# Patient Record
Sex: Male | Born: 1945 | Race: White | Hispanic: No | Marital: Married | Smoking: Never smoker
Health system: Southern US, Community
[De-identification: ages and names within clinical notes are randomized; demographics above are authoritative.]

## PROBLEM LIST (undated history)

## (undated) DIAGNOSIS — E119 Type 2 diabetes mellitus without complications: Secondary | ICD-10-CM

## (undated) DIAGNOSIS — M353 Polymyalgia rheumatica: Secondary | ICD-10-CM

## (undated) HISTORY — PX: NO PAST SURGERIES: SHX2092

## (undated) HISTORY — DX: Type 2 diabetes mellitus without complications: E11.9

## (undated) HISTORY — DX: Polymyalgia rheumatica: M35.3

---

## 1999-07-11 ENCOUNTER — Emergency Department (HOSPITAL_COMMUNITY): Admission: EM | Admit: 1999-07-11 | Discharge: 1999-07-12 | Payer: Self-pay | Admitting: Emergency Medicine

## 1999-07-12 ENCOUNTER — Encounter: Payer: Self-pay | Admitting: Emergency Medicine

## 2000-02-19 ENCOUNTER — Encounter: Payer: Self-pay | Admitting: *Deleted

## 2000-02-19 ENCOUNTER — Encounter: Payer: Self-pay | Admitting: Family Medicine

## 2000-02-19 ENCOUNTER — Inpatient Hospital Stay (HOSPITAL_COMMUNITY): Admission: EM | Admit: 2000-02-19 | Discharge: 2000-02-22 | Payer: Self-pay | Admitting: Emergency Medicine

## 2000-02-20 ENCOUNTER — Encounter: Payer: Self-pay | Admitting: Emergency Medicine

## 2004-03-10 ENCOUNTER — Encounter: Admission: RE | Admit: 2004-03-10 | Discharge: 2004-03-10 | Payer: Self-pay | Admitting: Gastroenterology

## 2004-03-23 ENCOUNTER — Ambulatory Visit (HOSPITAL_COMMUNITY): Admission: RE | Admit: 2004-03-23 | Discharge: 2004-03-23 | Payer: Self-pay | Admitting: Gastroenterology

## 2004-03-23 ENCOUNTER — Encounter (INDEPENDENT_AMBULATORY_CARE_PROVIDER_SITE_OTHER): Payer: Self-pay | Admitting: *Deleted

## 2006-04-10 IMAGING — RF DG ESOPHAGUS
7 series · 20 of 24 positions shown · non-contrast
Comparison: none

CLINICAL DATA: Dysphagia.
 BARIUM ESOPHAGRAM:
 Primary peristaltic wave in the esophagus is normal.  The cervical esophagus had a normal appearance.  There was a small sliding hiatal hernia with a distal esophageal mucosal ring (Schatzki ring) above the hiatal hernia.  This was not obstructive to a 13 mm in diameter barium tablet.  The remainder of the esophagus had a normal appearance.  There were no episodes of spontaneous gastroesophageal reflux.

[Series 1: run · 4 of 7 slices shown (1 of 7)]
[im 1/7]
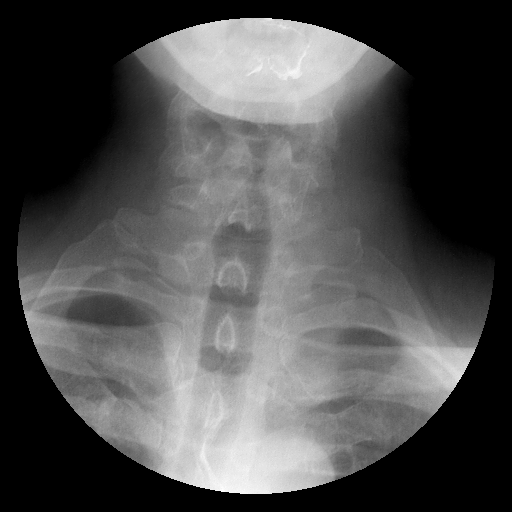
[im 2/7]
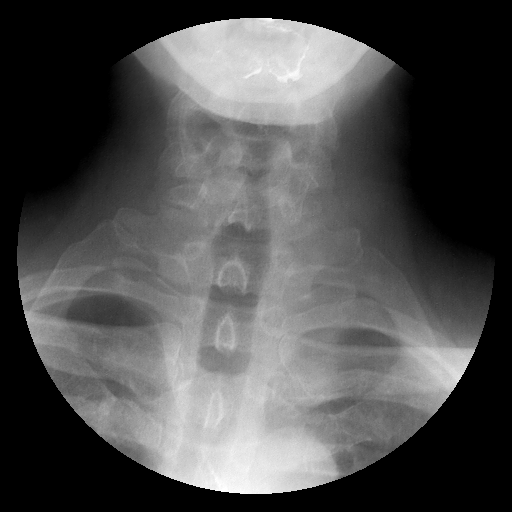
[im 5/7]
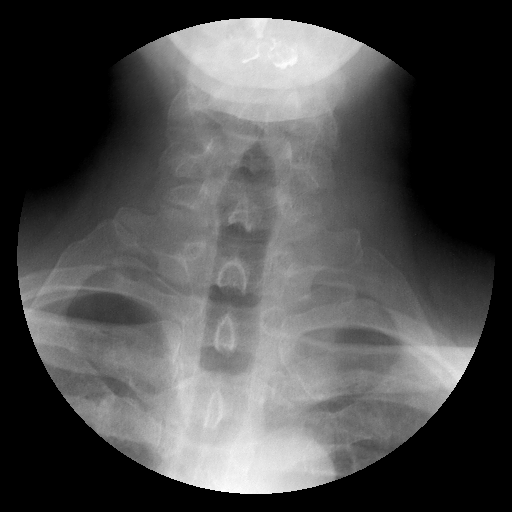
[im 7/7]
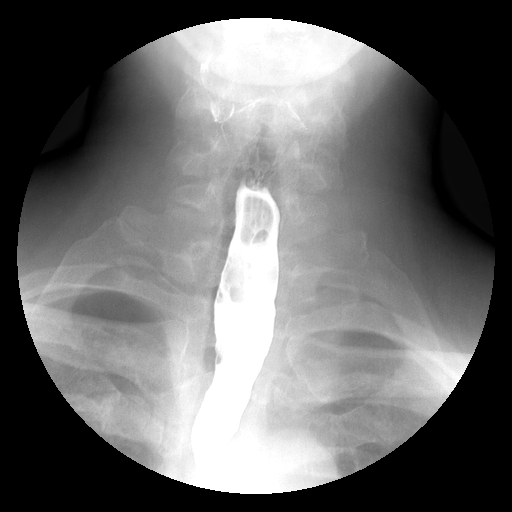

[Series 2: run · 4 of 5 slices shown (2 of 7)]
[im 1/5]
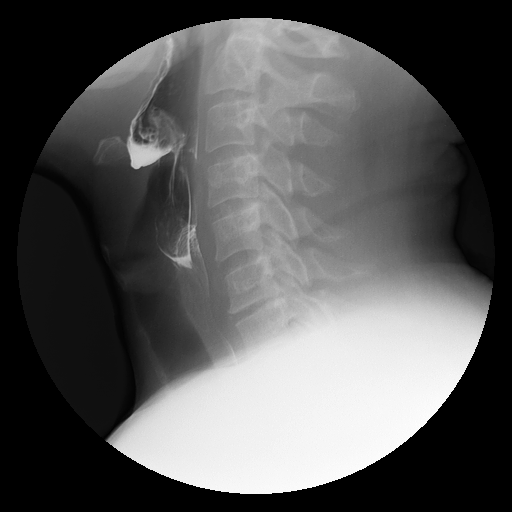
[im 2/5]
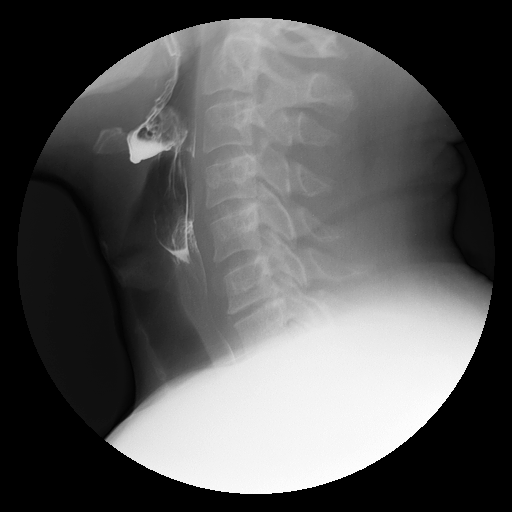
[im 3/5]
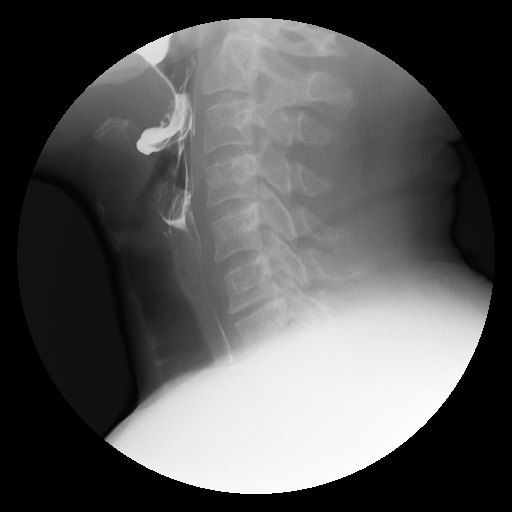
[im 5/5]
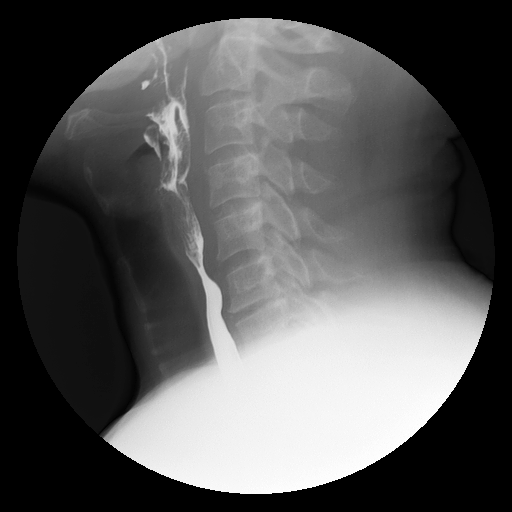

[Series 3: run · 4 of 5 slices shown (3 of 7)]
[im 1/5]
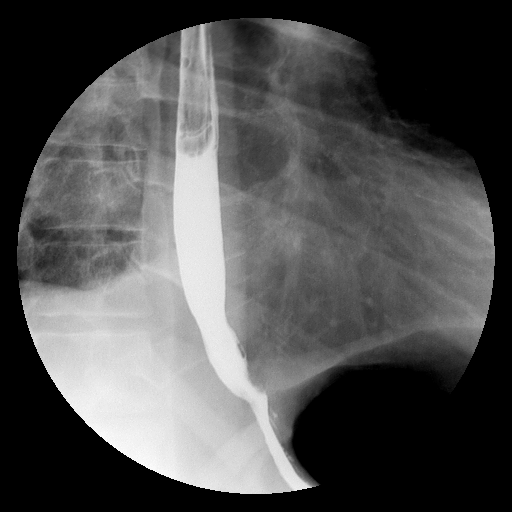
[im 2/5]
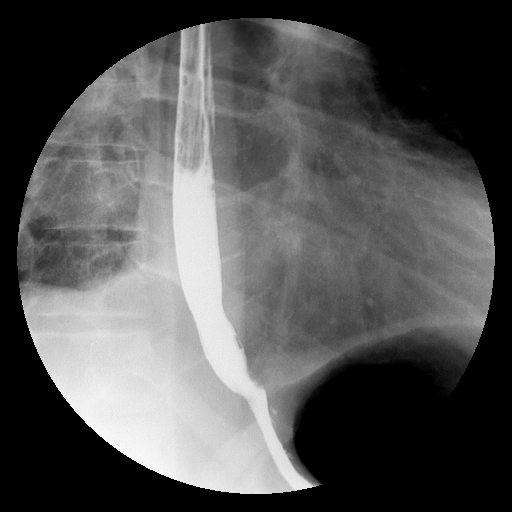
[im 3/5]
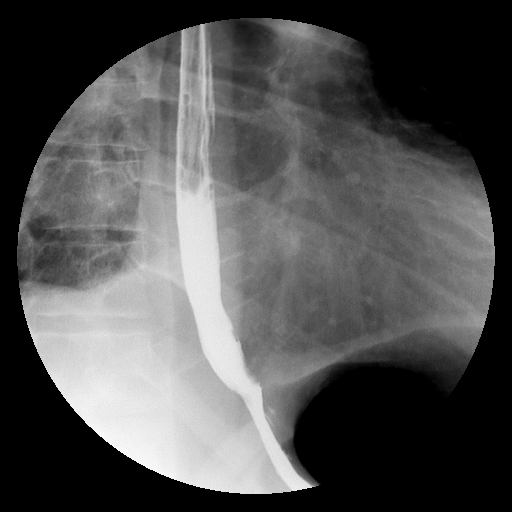
[im 4/5]
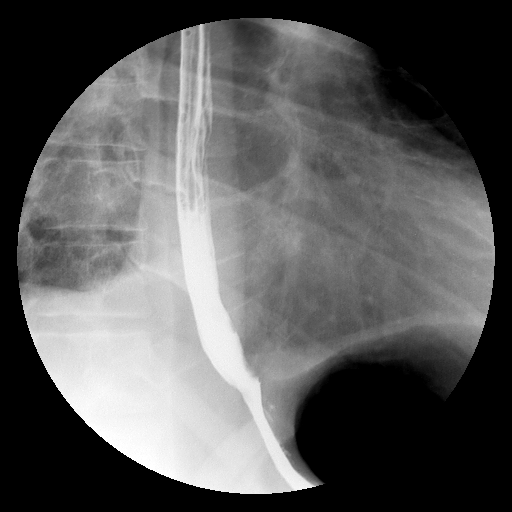

[Series 4: run · 3 of 3 slices shown (4 of 7)]
[im 1/3]
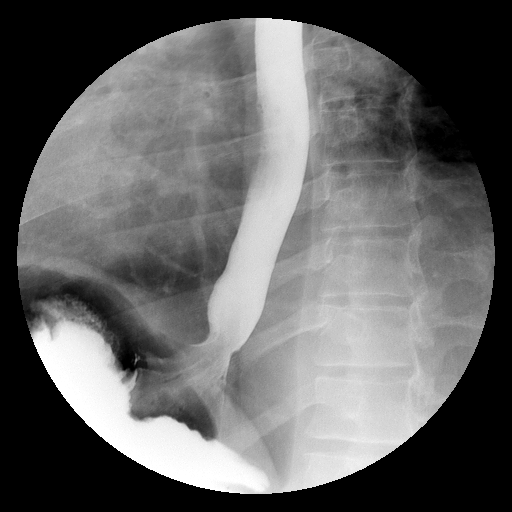
[im 2/3]
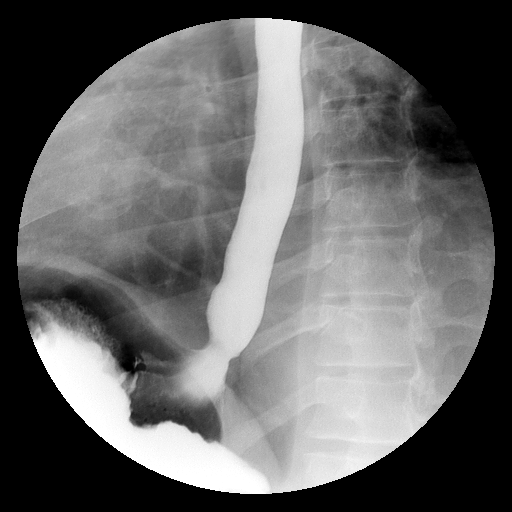
[im 3/3]
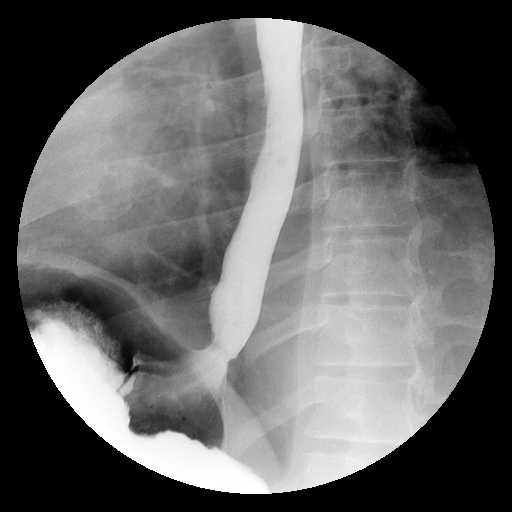

[Series 5: run · 2 of 3 slices shown (5 of 7)]
[im 1/3]
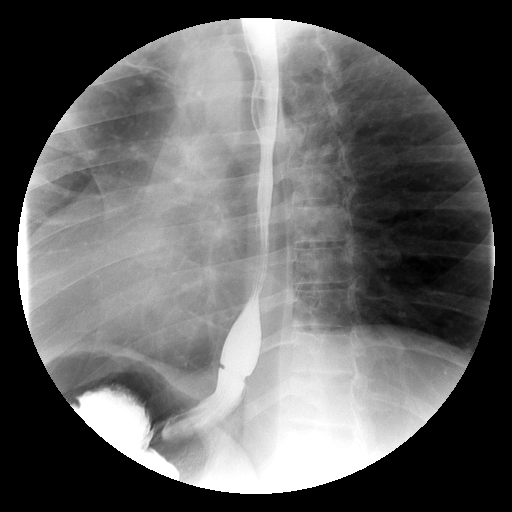
[im 2/3]
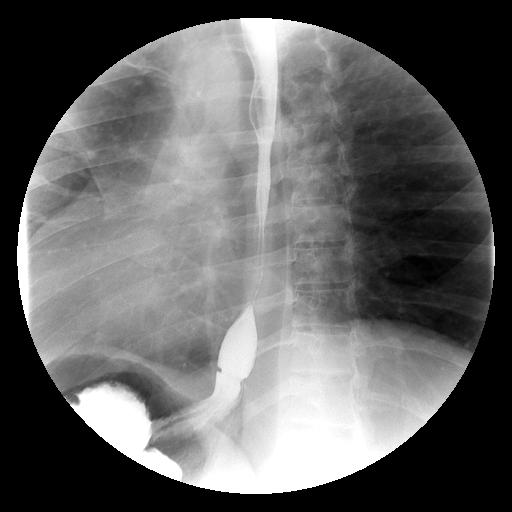

[Series 6: run · 2 of 2 slices shown (6 of 7)]
[im 1/2]
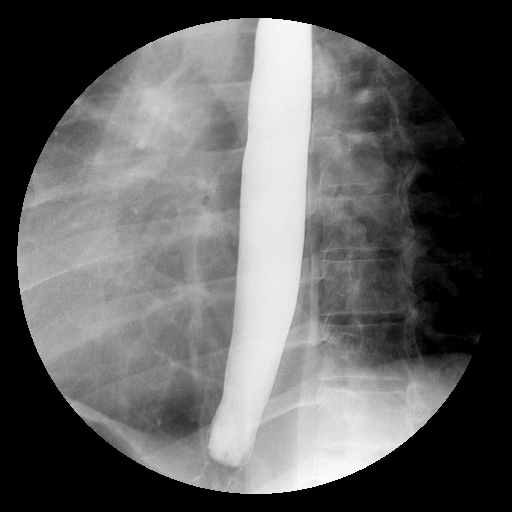
[im 2/2]
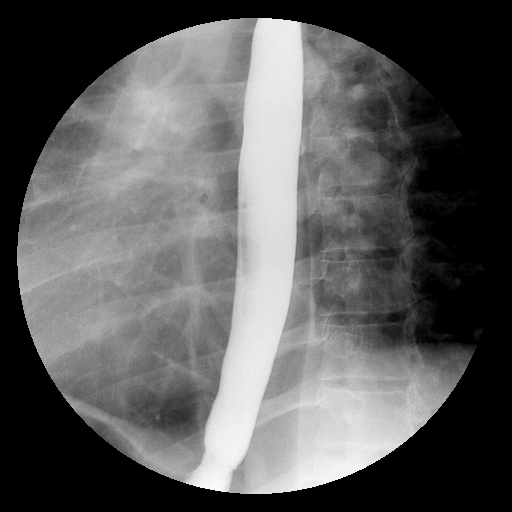

[Series 7: run · 1 of 1 slices shown (7 of 7)]
[im 1/1]
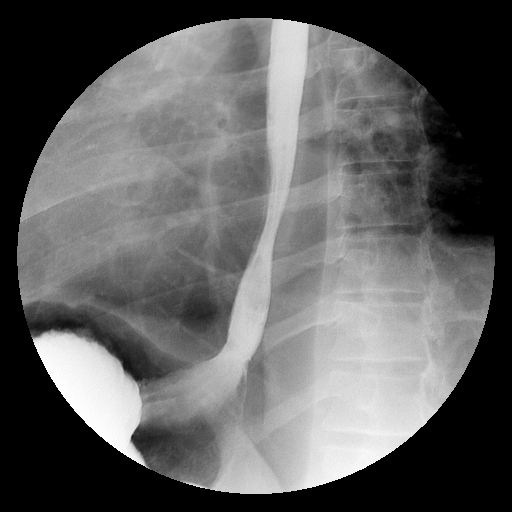

[20 of 24 positions shown; findings below may reference images not displayed]

IMPRESSION: Small sliding hiatal hernia with distal esophageal mucosal ring/stricture seen above the hiatal hernia (this is not obstructive to a 13 mm in diameter barium tablet).

## 2006-12-06 ENCOUNTER — Encounter: Admission: RE | Admit: 2006-12-06 | Discharge: 2006-12-06 | Payer: Self-pay | Admitting: General Surgery

## 2006-12-07 ENCOUNTER — Encounter (INDEPENDENT_AMBULATORY_CARE_PROVIDER_SITE_OTHER): Payer: Self-pay | Admitting: General Surgery

## 2006-12-07 ENCOUNTER — Ambulatory Visit (HOSPITAL_BASED_OUTPATIENT_CLINIC_OR_DEPARTMENT_OTHER): Admission: RE | Admit: 2006-12-07 | Discharge: 2006-12-07 | Payer: Self-pay | Admitting: General Surgery

## 2008-09-15 ENCOUNTER — Emergency Department (HOSPITAL_COMMUNITY): Admission: EM | Admit: 2008-09-15 | Discharge: 2008-09-16 | Payer: Self-pay | Admitting: Emergency Medicine

## 2010-06-15 NOTE — Op Note (Signed)
NAME:  GLOVER, CAPANO NO.:  [id number]   MEDICAL RECORD NO.:  [id number]          PATIENT TYPE:  AMB   LOCATION:  DSC                          FACILITY:  [facility]   PHYSICIAN:  Cherylynn Ridges, M.D.    DATE OF BIRTH:  Oct 12, 1945   DATE OF PROCEDURE:  12/07/2006  DATE OF DISCHARGE:                               OPERATIVE REPORT   PREOPERATIVE DIAGNOSIS:  Right chest wall lipoma.   POSTOPERATIVE DIAGNOSIS:  A 7 x 6 cm right chest wall lipoma.   PROCEDURE:  Excision of right chest wall lipoma.   SURGEON:  Jimmye Norman, M.D.   ANESTHESIA:  Monitored anesthesia care with 1% Xylocaine and 0.25%  Marcaine local with epinephrine.   ESTIMATED BLOOD LOSS:  Less than 10 mL.   COMPLICATIONS:  None.   CONDITION:  Stable.   FINDINGS:  Normal detachable 7 x 6 cm lipoma of the lateral right chest  wall.   DESCRIPTION OF OPERATION:  The patient was taken to the operating room  and placed on the table in the supine position.  After an adequate  amount of IV sedation was given, he was tilted up to the left somewhat  and then his right chest wall prepped and draped in the usual sterile  manner.   We anesthetized directly on top of the lipoma which was marked with a  marking pen.  We then made an incision using a 15 blade down into the  subcutaneous tissue.  It was below the level of Scarpa fascia that we  were able to excise this large lipoma 7 x 6 cm in size using  electrocautery.  We then closed using 3-0 Vicryl deep layer and then a  subcuticular stitch of 4-0 Monocryl on the skin.  Prior to closure,  0.25% Marcaine with epi was injected into the wound.  Sterile dressing  was applied including Dermabond, Steri-Strips and Tegaderm.  All counts  were correct.      Cherylynn Ridges, M.D.  Electronically Signed     JOW/MEDQ  D:  12/07/2006  T:  12/07/2006  Job:  [id number]

## 2010-06-18 NOTE — Consult Note (Signed)
[place]  Patient:    Derek Atkins, Derek Atkins                       MRN: [id number] Proc. Date: 02/19/00 Adm. Date:  [id number] Attending:  Irving Copas CC:         Illa Level, M.D.   Consultation Report  DATE OF BIRTH:  26-Feb-1945  CHIEF COMPLAINT:  Unsteadiness and slurred speech.  HISTORY OF THE PRESENT CONDITION:  The patient is a 65 year old right-handed married Caucasian gentleman who had the sudden onset of swimmy headedness, dysarthria, incoordination and a mild left occipital headache.  The patient had to hold on to keep from falling on over.  He thought that he was just hungry.  He ate lunch and rested, but continued to have symptoms. The men he was with insisted on him coming to the hospital.  The episode happened around noon, and he presented for evaluation around 3:00 p.m.  In the emergency room, the patients symptoms have largely subside other than mild unsteadiness and a feeling of weakness in the fifth finger on the right hand.  The patient also feels that he may be speaking somewhat indistinctly.  REVIEW OF SYSTEMS:  The patient has been a healthy person without change in appetite or sleep patterns, night sweats or fevers.  HEAD AND NECK: No otitis media pharyngitis or sinusitis.  CARDIOVASCULAR: No palpitations, chest pain, shortness of breath, or dyspnea on exertion.  RESPIRATORY: No asthma, bronchitis, pneumonia, hemoptysis or productive cough.  GASTROINTESTINAL: No nausea, vomiting, diarrhea or constipation.  GENITOURINARY: No hematuria or dysuria.  No urgency or frequency.  MUSCULOSKELETAL: No fractures, sprains or deformities.  SKIN: No rash.  HEMATOLOGIC: No anemia, bruisability or lymphadenopathy.  ENDOCRINE: No diabetes or thyroid disease.  IMMUNOLOGY: No allergies or immune disorders.  REPRODUCTIVE: No erectile dysfunction. NEUROLOGIC: See above.  No dysphasia, tinnitus, syncope, vertigo,  weakness, numbness, tingling, or loss of bowel or bladder control.  No seizures. PSYCHIATRIC: No anxiety, depression, mood disorder or psychosis.  Review of systems is otherwise negative.  CURRENT MEDICATIONS:  None.  ALLERGIES:  None.  PAST MEDICAL HISTORY:  The patient has been hospitalized once for otitis media with complications when he was in junior high school.  PAST SURGICAL HISTORY:  None.  FAMILY HISTORY:  The patient was adopted and has no knowledge of his biologic parents.  SOCIAL HISTORY:  The patient is married.  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  He drinks one beer per day.  He does not use tobacco or drugs.  PHYSICAL EXAMINATION:  GENERAL:  Well-developed, well-nourished, pleasant, right-handed gentleman in no distress.  VITAL SIGNS:  Blood pressure 119/75, resting pulse 70, respirations 20 pulse oximetry 98%, temperature 98.4.  HEENT:  No signs of infection.  No cranial bruits.  NECK:  Supple.  Full range of motion.  No cervical bruits.  LUNGS:  Clear to auscultation.  HEART:  No murmurs.  Pulses normal.  ABDOMEN:  Soft and nontender.  Bowel sounds normal .  No hepatosplenomegaly.  EXTREMITIES:  Normal.  No edema, cyanosis or altered tone.  NEUROLOGIC:  Mental status - Awake, alert, attentive, appropriate, no dysphasia or dyspraxia.  Cranial nerves - Round reactive pupils.  Normal fundi.  Full visual fields to double simultaneous stimuli.  Extraocular movements full and conjugate.  OKN responses equal bilaterally.  Symmetric fascial strength and sensation.  Air conduction greater than bone  conduction bilaterally.  Midline tongue and uvula.  Motor - Normal strength, tone and mass.  Good fine motor movements.  No pronator drift.  Sensation intact to cold, vibration, stereoagnosis.  Two-point discrimination was 3 mm bilaterally. Cerebellar - Good finger-to-nose and rapid repetitive movements. Gait was normal except  for tandem.  He was a little clumsy.  He was able to get up on his heels and toes well.  Deep tendon reflexes were normal except at the ankles, which were diminished.  The patient had bilateral flexor plantar responses.  IMPRESSION:  Transient ischemic attack - I suspect brainstem or cerebellar location, 435.8.  No known risk factors.  I cannot rule out a migraine variant, but I think it is unlikely.  PLAN: 1. IV heparin tonight and throughout the hospitalization until workup is    complete. 2, Workup for treatable causes of stroke in the young including procoagulants,    protein S, protein C, antithrombin 3, factor V Leiden mutation, vasculitis    (anticardiolipin antibody panel, sedimentation rate and ANA), amino    acidopathy, serum homocystine toxins (cocaine), dyslipidemia lipid panel.    All of the above except for lipid panel on January 19.  The lipid panel    will be fasting on January 20.  MRI/MRA intracranial on January 20.    Two-dimensional echocardiogram and carotid Doppler on January 21. Secondary    stroke prevention will be determined based on the etiology and any risk    factors that have been identified.  SUMMARY:  The patients examination is basically normal at this time, and so rehabilitation will not be necessary.  I appreciate the opportunity to see the patient.  This has been discussed at length with Dr. Abigail Miyamoto and I have personally reviewed the CT scan of the brain tonight, which is normal.  OTHER LABORATORY DATA:  Sodium 138, potassium 4.4, chloride 104, CO2 32, BUN 14, creatinine 1.4, glucose 115.  Blood gas shows pH 7.37, PCO2 (venous) 52.1, bicarbonate 30.  EKG shows normal sinus rhythm without evidence of prolonged Q-T interval. DD:  02/19/00 TD:  02/20/00 Job: [id number]

## 2010-06-18 NOTE — Op Note (Signed)
NAME:  Derek Atkins, Derek Atkins NO.:  [id number]   MEDICAL RECORD NO.:  [id number]          PATIENT TYPE:  AMB   LOCATION:  ENDO                         FACILITY:  [facility]   PHYSICIAN:  Graylin Shiver, M.D.   DATE OF BIRTH:  1946/01/11   DATE OF PROCEDURE:  03/23/2004  DATE OF DISCHARGE:                                 OPERATIVE REPORT   PROCEDURE:  Esophagogastroduodenoscopy with endoscopic balloon dilatation of  a Schatzki's ring.   ENDOSCOPIST:  Graylin Shiver, M.D.   INDICATIONS FOR PROCEDURE:  Dysphagia, abnormal barium swallow showing a  hiatal hernia with a Schatzki's ring.   INFORMED CONSENT:  Informed consent was obtained after explanation of the  risks of bleeding, infection and perforation.   PREMEDICATIONS:  Fentanyl 40 mcg IV, Versed 4 mg IV.   PROCEDURE:  With the patient in the left lateral decubitus position, the  Olympus gastroscope was inserted into the oropharynx and passed into the  esophagus.  It was advanced down the esophagus, then into the stomach and  into the duodenum.  The second portion and bulb of the duodenum were normal.  The stomach had normal-appearing mucosa in the antrum and body as well as  the upper fundus and cardia seen on retroflexion.  The scope was  straightened and brought back.  There was a small hiatal hernia.  At 38 cm,  there was a distal esophageal Schatzki's ring.  An endoscopic balloon  dilator was advanced down the scope and appropriately placed at the level of  the Schatzki's ring.  It was inflated to 15, then 16.5 mm, held in place at  each level for 30 seconds.  It was then inflated to 18 mm and held in place  for 1 minute.  The balloon was then deflated and removed.  There was some  heme at the site of the dilatation, but not much.  The rest of the esophagus  looked normal.  He tolerated the procedure well without complications.   IMPRESSION:  1.  Schatzki's ring, dilated to 18 mm.  2.  Hiatal hernia.   PLAN:   We will observe the response to the dilatation; hopefully, this will  improve his symptoms of dysphagia.      SFG/MEDQ  D:  03/23/2004  T:  03/23/2004  Job:  [id number]   cc:   Dellis Anes. Idell Pickles, M.D.  [address]  [place]  [place] [address]  Fax: [phone]

## 2010-06-18 NOTE — Op Note (Signed)
NAME:  Derek Atkins, Derek Atkins NO.:  [id number]   MEDICAL RECORD NO.:  [id number]          PATIENT TYPE:  AMB   LOCATION:  ENDO                         FACILITY:  [facility]   PHYSICIAN:  Graylin Shiver, M.D.   DATE OF BIRTH:  1945-11-09   DATE OF PROCEDURE:  03/23/2004  DATE OF DISCHARGE:                                 OPERATIVE REPORT   PROCEDURE:  Colonoscopy with biopsy.   ENDOSCOPIST:  Graylin Shiver, M.D.   INDICATIONS FOR PROCEDURE:  Rectal bleeding.  Rule out colon lesion.   INFORMED CONSENT:  An informed consent was explained after an explanation of  the risks of bleeding, infection and perforation.   PREMEDICATION:  The procedure was done after an EGD with dilatation, with an  additional 20 mcg of fentanyl and 2 mg of Versed given IV.   DESCRIPTION OF PROCEDURE:  With the patient in the left lateral decubitus  position, a rectal exam was performed.  No masses were felt.  The Olympus  colonoscope was inserted into the rectum and advanced around the colon to  the cecum.  The cecal landmarks were identified.  The cecum and ascending  colon looked normal.  The transverse colon looked normal.  The descending  colon and sigmoid revealed diverticulosis.  In the rectum there was some  patchy erythema.  This was biopsied to see if this is consistent with  proctitis.  The scope was retroflexed in the rectum.  There were some small  internal hemorrhoids.  The scope was drained and brought out.   He tolerated the procedure well without complications.   IMPRESSION:  1.  Diverticulosis of the left colon.  2.  Erythema in the rectum, which was biopsied to look for any evidence of      proctitis.  3.  Internal hemorrhoids.      SFG/MEDQ  D:  03/23/2004  T:  03/23/2004  Job:  [id number]   cc:   Dellis Anes. Idell Pickles, M.D.  [address]  [place]  [place] [address]  Fax: [phone]

## 2010-06-18 NOTE — H&P (Signed)
[place]. [facility]  Patient:    Derek Atkins, Derek Atkins                       MRN: [id number] Adm. Date:  02/19/00 Attending:  Chales Salmon. Abigail Miyamoto, M.D.                         History and Physical  HISTORY OF PRESENT ILLNESS:  This 65 year old white male had the sudden onset at about noon today of dizziness which he says was not vertigo.  Slurred speech, lack of coordination, and mild headache.  He had no loss of consciousness.  He denied any chest pain or palpitations.  He ate lunch and rested briefly, but his symptoms persisted, so he presented to the emergency room.  In the emergency room his symptoms are improved, but not resolved completely.  He feels that his speech is a little bit affected, and that walking is more difficult.  He also complains of some weakness in the fifth finger of his right hand, although he has never had a similar problem before. CT is negative.  He is admitted for further evaluation of a suspected TIA.  HOSPITALIZATIONS:  None.  MEDICATIONS:  None.  ALLERGIES:  None.  SOCIAL HISTORY:  The patient is married.  His wife is well.  He has no biologic children.  He does not smoke.  He drinks one drink a day.  He works for [employer].  FAMILY HISTORY:  He is adopted and has no knowledge of his biologic parents.  REVIEW OF SYSTEMS:  Unremarkable.  PHYSICAL EXAMINATION:  GENERAL:  The patient is alert and oriented x 3.  VITAL SIGNS:  Blood pressure is 150/80.  HEENT:  PERRLA.  EOMs intact.  Fundi benign.  TMs and pharynx clear.  NECK:  Carotids are full without bruits.  There are no masses of the neck, no thyromegaly.  LUNGS:  Clear to P&A.  HEART:  Heart is in regular rhythm with no ectopy.  ABDOMEN:  Soft, nontender with no hepatosplenomegaly or mass.  EXTREMITIES:  There is no peripheral edema.  He has good peripheral pulses.  NEUROLOGIC:  Cranial nerves grossly intact.  Strength is full and symmetric. Sensation is grossly  intact.  Patient is able to walk, but his gait is a bit wide based and he sways slightly.  Romberg is negative.  Toes are downgoing bilaterally.  LABORATORY:  CT of his brain is negative.  Basic metabolic profile is normal.  EKG shows no acute changes.  CBC reveals a white count of 13,100, hemoglobin 15.5, hematocrit 45.1.  DIAGNOSIS:  Transient ischemic attack.  PLAN:  Will admit to telemetry and begin heparin protocol, MRI, MRA, carotid Dopplers, and coagulation studies are ordered.  Dr. Ellison Carwin will see the patient in consultation. DD:  02/19/00 TD:  02/19/00 Job: [id number]

## 2010-06-18 NOTE — Discharge Summary (Signed)
[place]. [facility]  Patient:    Derek Atkins, Derek Atkins                       MRN: [id number] Adm. Date:  [id number] Disc. Date: 02/22/00 Attending:  Irving Copas CC:         Deanna Artis. Sharene Skeans, M.D.             Al Decant. Janey Greaser, M.D., [facility]             Chales Salmon. Abigail Miyamoto, M.D., [facility]                           Discharge Summary  DATE OF BIRTH:  March 10, 1945  DISCHARGE DIAGNOSES: 1. 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    A. Transient slurred speech, dysmetria, vertigo, gait disorder, right sided       weakness, resolved.    B. On aspirin and Zocor.    C. Negative CT scan and MRI of the brain.    D. 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    E. Sedimentation rate III 118, homocystine level 8.29, positive ANA,       other studies pending.    F. Negative arterial carotid Dopplers with antegrade vertebral flow, 2-D       echo pending. 2. Hypercholesterolemia.    A. Cholesterol 228, HDL 46, LDL 167, triglycerides 77.  DISCHARGE FOLLOWUP AND DISPOSITION:  Derek Atkins is being discharged home. He will be followed by Dr. Doran Clay in [facility] on March 01, 2000 at 10 a.m. Dr. Janey Greaser is to followup the patients LFTs as the patient was started on Zocor for dyslipidemia during this hospital stay. Dr. Ellison Carwin will be available for further consultation as an outpatient is needed. We also recommend to follow the patients renal function he had a mild elevation of creatinine between 1.2 and 1.4, of unknown significance, since the patient was dehydrated.  If the patient were to have recurrent neurologic deficits, further workup for vasculitis and cerebral angiogram will be indicated.  DISCHARGE MEDICATIONS: 1. Enteric  coated aspirin one tablet p.o. q.d. (325 mg). 2. Zocor 20 mg p.o. q.d.  CONSULTATIONS:  Dr. Ellison Carwin, neurology.  PROCEDURES: 1. A 2-D echocardiogram, pending results. 2. Negative carotid arterial Dopplers with normal posterior circulation blood    flow. 3. CT scan of the brain, negative. 4. MRI/MRA of the brain, as above.  DISCHARGE LABORATORY DATA:  Anticardiolipin antibody panel pending. Protein C and protein S pending. Factor Leyden V chain mutation pending. Urine drug screen negative. Sedimentation rate 7, antithrombin III, 118 (within normal limits). Cholesterol 228, HDL 46, LDL 167, triglycerides 77. Hemoglobin 14.6, MCV 2, WBC 6.2, platelets 212. Creatinine 1.4. Positive ANA. Homocystine level 8.29.  HOSPITAL COURSE:  Derek Atkins is a very pleasant 65 year old male admitted to [facility] on February 19, 2000 with complaints of vertigo, gait disbalance and right sided weakness associated with slurred speech. Please see admission H&P by Dr. Henrine Screws for further details regarding the history of presentation, physical exam and lab data.  PROBLEM #1:  POSTERIOR CIRCULATION RIND/TIA:  As described previously, and as well as in the H&P, the patient presented with dysmetria in the right hand, slurred speech,  gait disorder with tendency towards the right, slight weakness of the arm and leg. CT scan of the brain was negative in the emergency department. The patient was placed on Heparin and a neurological consultation was obtained. Dr. Sharene Skeans ordered a hypercoagulability panel. Also, an MRI of the brain was obtained for further evaluation. The imaging study showed no acute, nor subacute, no old stroke. The MRA of the brain probably was negative, although due to the lack of specificity with this test and medium and small vessels a bilateral MCA narrowing was described by the radiologist at the portion in which the internal carotid arteries entered the  skull. Clinically, the patient had a posterior circulation deficit that started to improve steadily on Heparin. Most of the studies to rule out hypercoagulbility state came back negative, as described in the discharge lab data. Still there are several studies pending. The patient had a normal carotid arterial Doppler. The vertebral flow was antegrade. For this reason, an angiogram was now warranted. It was unclear what significance the positive ANA had. Systemically, there was no evidence of ALE nor vasculitis. For this reason, we decided to treat this patient with full strength aspirin and statins as he had hypercholesterolemia during this hospital stay. The patient had a previous history of dyslipidemia treated with diet and exercise. Given the TIA associated with persistent dyslipidemia, we decided to start this medication. The LFTs will be followed as an outpatient at the [facility]. The stroke team evaluated Derek Atkins finding no problems from the physical nor the neurovascular standpoint. At the time of discharge, no new deficits were appreciated. As described in the discharge instructions, if the patient were to have recurrent neural deficits a brain angiogram and further workup for vasculitis will be recommended. A 2-D echocardiogram was pending at the time of discharge. No heart murmurs nor clinical evidence was noted that could support an embolic stroke, at this point. The results of the 2-D echocardiogram, as well as the rest of the hypercoagulability panel will be followed up by Dr. Janey Greaser upon discharge.  PROBLEM #2:  DYSLIPIDEMIA:  As per problem #1.  MEDICAL CONDITION AT DISCHARGE:  Improved. DD:  02/22/00 TD:  02/22/00 Job: [id number]

## 2010-06-18 NOTE — Discharge Summary (Signed)
[place]. [facility]  Patient:    Derek Atkins, Derek Atkins                       MRN: [id number] Adm. Date:  [id number] Disc. Date: 02/22/00 Attending:  Irving Copas CC:         Deanna Artis. Sharene Skeans, M.D.             Al Decant. Janey Greaser, M.D., [facility]             Chales Salmon. Abigail Miyamoto, M.D.                           Discharge Summary  DATE OF BIRTH: Jan 30, 1946.  ADDENDUM:  On the day of discharge, RPR level was obtained given the positive ANA.  The patient denied any risk factors that can contribute to syphilis, though given the clear significance of the positive ANA, an RPR level as well as anti-DNA level were obtained too for further evaluation.  The results will be followed by the patients primary care physician in [facility]. DD:  02/22/00 TD:  02/22/00 Job: [id number]

## 2010-11-09 LAB — POCT HEMOGLOBIN-HEMACUE
Hemoglobin: 16.2
Operator id: 128471

## 2014-03-04 ENCOUNTER — Ambulatory Visit
Admission: RE | Admit: 2014-03-04 | Discharge: 2014-03-04 | Disposition: A | Discharge status: Home / Self Care | Payer: Medicare Other | Source: Ambulatory Visit | Attending: Family Medicine | Admitting: Family Medicine

## 2014-03-04 ENCOUNTER — Other Ambulatory Visit: Payer: Self-pay | Admitting: Family Medicine

## 2014-03-04 DIAGNOSIS — M545 Low back pain: Secondary | ICD-10-CM

## 2014-12-09 ENCOUNTER — Encounter: Payer: Medicare Other | Attending: Family Medicine

## 2014-12-09 VITALS — Ht 68.0 in | Wt 193.0 lb

## 2014-12-09 DIAGNOSIS — E119 Type 2 diabetes mellitus without complications: Secondary | ICD-10-CM | POA: Insufficient documentation

## 2014-12-09 DIAGNOSIS — Z713 Dietary counseling and surveillance: Secondary | ICD-10-CM | POA: Insufficient documentation

## 2014-12-09 NOTE — Progress Notes (Signed)
Diabetes Self-Management Education  Visit Type: First/Initial  Appt. Start Time: 8:00 Appt. End Time: 9:00  12/09/2014  [name], identified by name and date of birth, is a 69 y.o. male with a diagnosis of Diabetes: Type 2.   ASSESSMENT  Height 5\' 8"  (1.727 m), weight 193 lb (87.544 kg). Body mass index is 29.35 kg/(m^2). Core Class 1     Diabetes Self-Management Education - 12/09/14 1033    Visit Information   Visit Type First/Initial   Initial Visit   Diabetes Type Type 2   Are you currently following a meal plan? No   Are you taking your medications as prescribed? Not on Medications   Date Diagnosed last week   Health Coping   How would you rate your overall health? Good   Psychosocial Assessment   Patient Belief/Attitude about Diabetes Motivated to manage diabetes   Self-care barriers None   Patient Concerns Nutrition/Meal planning   Special Needs None   Preferred Learning Style No preference indicated   How often do you need to have someone help you when you read instructions, pamphlets, or other written materials from your doctor or pharmacy? 1 - Never   Complications   Last HgB A1C per patient/outside source 7 %   How often do you check your blood sugar? 0 times/day (not testing)   Have you had a dilated eye exam in the past 12 months? Yes   Have you had a dental exam in the past 12 months? Yes   Are you checking your feet? No   Exercise   Exercise Type ADL's   Patient Education   Previous Diabetes Education No   Disease state  Definition of diabetes, type 1 and 2, and the diagnosis of diabetes;Factors that contribute to the development of diabetes   Outcomes   Future DMSE --  1 week for core 2   Program Status Not Completed      Individualized Plan for Diabetes Self-Management Training:   Learning Objective:  Patient will have a greater understanding of diabetes self-management. Patient education plan is to attend individual and/or group  sessions per assessed needs and concerns.   Plan:   There are no Patient Instructions on file for this visit.  Expected Outcomes:     Education material provided: Living Well with Diabetes, A1C conversion sheet, Meal plan card, My Plate and Snack sheet  If problems or questions, patient to contact team via:  Phone  Future DSME appointment:  (1 week for core 2)

## 2014-12-16 DIAGNOSIS — E119 Type 2 diabetes mellitus without complications: Secondary | ICD-10-CM | POA: Diagnosis not present

## 2014-12-16 NOTE — Progress Notes (Signed)

## 2014-12-23 DIAGNOSIS — E119 Type 2 diabetes mellitus without complications: Secondary | ICD-10-CM | POA: Diagnosis not present

## 2014-12-23 NOTE — Progress Notes (Signed)
Patient was seen on 12/23/14 for the third of a series of three diabetes self-management courses at the [facility].   Janene Madeira. State the amount of activity recommended for healthy living . Describe activities suitable for individual needs . Identify ways to regularly incorporate activity into daily life . Identify barriers to activity and ways to over come these barriers  Identify diabetes medications being personally used and their primary action for lowering glucose and possible side effects . Describe role of stress on blood glucose and develop strategies to address psychosocial issues . Identify diabetes complications and ways to prevent them  Explain how to manage diabetes during illness . Evaluate success in meeting personal goal . Establish 2-3 goals that they will plan to diligently work on until they return for the  6351-month follow-up visit  Goals:   I will count my carb choices at most meals and snacks  Add protein to breakfast  I will be active during commercial breaks  I will set aside time to relax each day  I will look at patterns in my record book at least 1 days a month   Your patient has identified these potential barriers to change:  Motivation  Your patient has identified their diabetes self-care support plan as  Family Support, medical provider  Plan:  Attend Optional Core 4 in 4 months

## 2016-10-14 ENCOUNTER — Other Ambulatory Visit: Payer: Self-pay | Admitting: Physician Assistant

## 2016-10-14 DIAGNOSIS — Z7952 Long term (current) use of systemic steroids: Secondary | ICD-10-CM

## 2016-11-01 ENCOUNTER — Ambulatory Visit
Admission: RE | Admit: 2016-11-01 | Discharge: 2016-11-01 | Disposition: A | Discharge status: Home / Self Care | Payer: Medicare Other | Source: Ambulatory Visit | Attending: Physician Assistant | Admitting: Physician Assistant

## 2016-11-01 DIAGNOSIS — Z7952 Long term (current) use of systemic steroids: Secondary | ICD-10-CM

## 2017-10-19 ENCOUNTER — Other Ambulatory Visit: Payer: Self-pay | Admitting: Family Medicine

## 2017-10-19 DIAGNOSIS — R42 Dizziness and giddiness: Secondary | ICD-10-CM

## 2017-10-19 DIAGNOSIS — S060X0A Concussion without loss of consciousness, initial encounter: Secondary | ICD-10-CM

## 2017-10-20 ENCOUNTER — Ambulatory Visit
Admission: RE | Admit: 2017-10-20 | Discharge: 2017-10-20 | Disposition: A | Discharge status: Home / Self Care | Payer: Medicare Other | Source: Ambulatory Visit | Attending: Family Medicine | Admitting: Family Medicine

## 2017-10-20 DIAGNOSIS — R42 Dizziness and giddiness: Secondary | ICD-10-CM

## 2017-10-20 DIAGNOSIS — S060X0A Concussion without loss of consciousness, initial encounter: Secondary | ICD-10-CM

## 2017-10-24 ENCOUNTER — Ambulatory Visit (INDEPENDENT_AMBULATORY_CARE_PROVIDER_SITE_OTHER): Payer: Medicare Other | Admitting: Neurology

## 2017-10-24 ENCOUNTER — Telehealth: Payer: Self-pay | Admitting: Neurology

## 2017-10-24 ENCOUNTER — Encounter: Payer: Self-pay | Admitting: Neurology

## 2017-10-24 ENCOUNTER — Other Ambulatory Visit: Payer: Self-pay

## 2017-10-24 ENCOUNTER — Ambulatory Visit
Admission: RE | Admit: 2017-10-24 | Discharge: 2017-10-24 | Disposition: A | Discharge status: Home / Self Care | Payer: Medicare Other | Source: Ambulatory Visit | Attending: Neurology | Admitting: Neurology

## 2017-10-24 VITALS — BP 118/70 | HR 67 | Ht 68.0 in | Wt 194.5 lb

## 2017-10-24 DIAGNOSIS — M25551 Pain in right hip: Secondary | ICD-10-CM

## 2017-10-24 NOTE — Telephone Encounter (Signed)
I called the patient.  X-ray of the right hip and pelvis is unremarkable, I discussed this with the patient.
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
## 2018-12-10 ENCOUNTER — Emergency Department (HOSPITAL_COMMUNITY)
Admission: EM | Admit: 2018-12-10 | Discharge: 2018-12-10 | Disposition: A | Discharge status: Home / Self Care | Payer: Medicare Other | Attending: Emergency Medicine | Admitting: Emergency Medicine

## 2018-12-10 ENCOUNTER — Encounter (HOSPITAL_COMMUNITY): Payer: Self-pay | Admitting: Emergency Medicine

## 2018-12-10 ENCOUNTER — Emergency Department (HOSPITAL_COMMUNITY): Payer: Medicare Other

## 2018-12-10 DIAGNOSIS — R112 Nausea with vomiting, unspecified: Secondary | ICD-10-CM | POA: Diagnosis not present

## 2018-12-10 DIAGNOSIS — E119 Type 2 diabetes mellitus without complications: Secondary | ICD-10-CM | POA: Insufficient documentation

## 2018-12-10 DIAGNOSIS — R519 Headache, unspecified: Secondary | ICD-10-CM | POA: Diagnosis not present

## 2018-12-10 DIAGNOSIS — R42 Dizziness and giddiness: Secondary | ICD-10-CM | POA: Diagnosis present

## 2018-12-10 LAB — URINALYSIS, ROUTINE W REFLEX MICROSCOPIC
Bilirubin Urine: NEGATIVE
Glucose, UA: NEGATIVE mg/dL
Hgb urine dipstick: NEGATIVE
Ketones, ur: NEGATIVE mg/dL
Leukocytes,Ua: NEGATIVE
Nitrite: NEGATIVE
Protein, ur: NEGATIVE mg/dL
Specific Gravity, Urine: 1.012 (ref 1.005–1.030)
pH: 7 (ref 5.0–8.0)

## 2018-12-10 LAB — CBC
HCT: 45.9 % (ref 39.0–52.0)
Hemoglobin: 15 g/dL (ref 13.0–17.0)
MCH: 29.4 pg (ref 26.0–34.0)
MCHC: 32.7 g/dL (ref 30.0–36.0)
MCV: 90 fL (ref 80.0–100.0)
Platelets: 164 10*3/uL (ref 150–400)
RBC: 5.1 MIL/uL (ref 4.22–5.81)
RDW: 13.6 % (ref 11.5–15.5)
WBC: 4.6 10*3/uL (ref 4.0–10.5)
nRBC: 0 % (ref 0.0–0.2)

## 2018-12-10 LAB — BASIC METABOLIC PANEL
Anion gap: 10 (ref 5–15)
BUN: 10 mg/dL (ref 8–23)
CO2: 24 mmol/L (ref 22–32)
Calcium: 8.9 mg/dL (ref 8.9–10.3)
Chloride: 105 mmol/L (ref 98–111)
Creatinine, Ser: 1.03 mg/dL (ref 0.61–1.24)
GFR calc Af Amer: >60 mL/min (ref >60)
GFR calc non Af Amer: >60 mL/min (ref >60)
Glucose, Bld: 138 mg/dL — ABNORMAL HIGH (ref 70–99)
Potassium: 4.1 mmol/L (ref 3.5–5.1)
Sodium: 139 mmol/L (ref 135–145)

## 2018-12-10 MED ORDER — SODIUM CHLORIDE 0.9% FLUSH: 3.0000 mL | Freq: Once | INTRAVENOUS | Status: DC

## 2018-12-10 MED ORDER — DIPHENHYDRAMINE HCL 50 MG/ML IJ SOLN
12.5000 mg | Freq: Once | INTRAMUSCULAR | Status: AC
  Administered 2018-12-10: 12:00:00 12.5 mg via INTRAVENOUS
  Filled 2018-12-10: qty 1

## 2018-12-10 MED ORDER — PROCHLORPERAZINE MALEATE 5 MG PO TABS: 5.0000 mg | ORAL_TABLET | Freq: Once | ORAL | Status: DC

## 2018-12-10 MED ORDER — LORAZEPAM 1 MG PO TABS
1.0000 mg | ORAL_TABLET | Freq: Once | ORAL | Status: AC
  Administered 2018-12-10: 1 mg via ORAL
  Filled 2018-12-10: qty 1

## 2018-12-10 MED ORDER — SODIUM CHLORIDE 0.9 % IV BOLUS
1000.0000 mL | Freq: Once | INTRAVENOUS | Status: AC
  Administered 2018-12-10: 12:00:00 1000 mL via INTRAVENOUS

## 2018-12-10 MED ORDER — PROCHLORPERAZINE EDISYLATE 10 MG/2ML IJ SOLN
5.0000 mg | Freq: Four times a day (QID) | INTRAMUSCULAR | Status: DC | PRN
  Administered 2018-12-10: 5 mg via INTRAVENOUS
  Filled 2018-12-10: qty 2

## 2018-12-10 MED ORDER — MECLIZINE HCL 12.5 MG PO TABS: 12.5000 mg | ORAL_TABLET | Freq: Three times a day (TID) | ORAL | 0 refills | Status: DC | PRN

## 2018-12-10 MED ORDER — MECLIZINE HCL 25 MG PO TABS
25.0000 mg | ORAL_TABLET | Freq: Once | ORAL | Status: AC
  Administered 2018-12-10: 12:00:00 25 mg via ORAL
  Filled 2018-12-10 (×2): qty 1

## 2018-12-10 NOTE — ED Notes (Signed)
Patient verbalizes understanding of discharge instructions. Opportunity for questioning and answers were provided. Armband removed by staff, pt discharged from ED with family.  

## 2018-12-10 NOTE — ED Notes (Signed)
Ambulated pt in hall, endorses only slight dizziness after coming back into the room.

## 2018-12-10 NOTE — ED Triage Notes (Signed)
Pt here from home with c/o dizziness and vertigo type symptoms , pt will vomit when the dizzy spells start worse with movement

## 2018-12-10 NOTE — ED Notes (Signed)
ED Provider at bedside. 

## 2018-12-10 NOTE — Discharge Instructions (Signed)
You were seen in the emergency room for vertigo.  Your lab work and exam was reassuring.  We got imaging of your brain just to be safe.  The imaging was reassuring as well.  We treated you with multiple medications.  Your symptoms eventually improved.  You were able to walk around safely.  We are discharging you home with a medication called meclizine.  We are also referring you to an ear nose and throat doctor as they tend to be the most helpful with vertigo.  We also recommend you follow-up with your primary care doctor within 1 week.

## 2018-12-10 NOTE — ED Provider Notes (Signed)
[facility] Provider Note   CSN: [id number] Arrival date & time: 12/10/18  0941     History   Chief Complaint Chief Complaint  Patient presents with  . Dizziness    HPI Derek Atkins is a 73 y.o. male.     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  The patient reports a few episodes of dizziness in the last few days which became constant overnight at which point he suffered falls nausea and vomiting.  He reports feeling like the room is spinning.  He reports feeling like he was very very drunk.  He denies dizziness upon standing.  His dizziness is exacerbated by movement especially lateral movement.  He has not tried anything for his symptoms.  He denies ringing in his ears or hearing loss.  Patient has a history of 2 weeks of cough with subjective low-grade fevers as high as 99 most recently a few days ago.  Otherwise he denies chills, rhinorrhea, sore throat, visual changes, chest pain, shortness of breath, abdominal pain, diarrhea, urinary changes, skin changes, mood changes.  He denies any sick contacts. Past Medical History:  Diagnosis Date  . Diabetes mellitus without complication (HCC)   . PMR (polymyalgia rheumatica) (HCC)     There are no active problems to display for this patient.   Past Surgical History:  Procedure Laterality Date  . NO PAST SURGERIES        Home Medications    Prior to Admission medications   Medication Sig Start Date End Date Taking? Authorizing Provider  predniSONE (DELTASONE) 5 MG tablet Take 1 mg by mouth daily with breakfast.     [provider]  tamsulosin (FLOMAX) 0.4 MG CAPS capsule Take 1 capsule by mouth at bedtime. 08/01/17   [provider]    Family History Family History  Problem Relation Age of Onset  . Hyperlipidemia Other     Social History Social History   Tobacco Use   . Smoking status: Never Smoker  . Smokeless tobacco: Never Used  Substance Use Topics  . Alcohol use: Yes    Comment: 1 glass wine per night  . Drug use: Never     Allergies   Patient has no known allergies.   Review of Systems Review of Systems  Constitutional: Positive for fever (subjective, up to 99 a few days ago). Negative for chills.  HENT: Negative for rhinorrhea and sore throat.   Eyes: Negative for visual disturbance.  Respiratory: Negative for shortness of breath.   Cardiovascular: Negative for chest pain.  Gastrointestinal: Positive for nausea and vomiting. Negative for abdominal pain and diarrhea.  Genitourinary: Negative for difficulty urinating.  Musculoskeletal:       2 falls from dizziness; did not hit or hurt anything per patient  Neurological: Positive for dizziness, light-headedness and headaches. Negative for syncope.  Psychiatric/Behavioral: Negative for dysphoric mood. The patient is not nervous/anxious.      Physical Exam Updated Vital Signs BP (!) 153/88   Pulse 71   Temp 98 F (36.7 C) (Oral)   Resp 19   SpO2 99%   Physical Exam Constitutional:      General: He is not in acute distress. HENT:     Head: Normocephalic and atraumatic.     Mouth/Throat:     Mouth: Mucous membranes are moist.     Pharynx: No posterior  oropharyngeal erythema.  Neck:     Musculoskeletal: Normal range of motion and neck supple.  Cardiovascular:     Rate and Rhythm: Normal rate and regular rhythm.     Pulses: Normal pulses.     Heart sounds: No murmur.  Pulmonary:     Effort: Pulmonary effort is normal. No respiratory distress.  Abdominal:     General: Abdomen is flat. There is no distension.     Palpations: Abdomen is soft.  Musculoskeletal: Normal range of motion.  Skin:    General: Skin is warm and dry.  Neurological:     General: No focal deficit present.     Mental Status: He is alert and oriented to person, place, and time.     Cranial Nerves:  Cranial nerve deficit:  horizontal nystagmus.     Sensory: No sensory deficit.     Motor: No weakness.  Psychiatric:        Mood and Affect: Mood normal.    ED Treatments / Results  Labs (all labs ordered are listed, but only abnormal results are displayed) Labs Reviewed  BASIC METABOLIC PANEL - Abnormal; Notable for the following components:      Result Value   Glucose, Bld 138 (*)    All other components within normal limits  CBC  URINALYSIS, ROUTINE W REFLEX MICROSCOPIC  CBG MONITORING, ED    EKG EKG Interpretation  Date/Time:  Monday December 10 2018 10:13:36 EST Ventricular Rate:  70 PR Interval:  160 QRS Duration: 92 QT Interval:  392 QTC Calculation: 423 R Axis:   66 Text Interpretation: Normal sinus rhythm Normal ECG No significant change since last tracing Confirmed by Deno Etienne [phone]) on 12/10/2018 11:39:04 AM   Radiology No results found.  Procedures Procedures (including critical care time)  Medications Ordered in ED Medications  sodium chloride flush (NS) 0.9 % injection 3 mL (3 mLs Intravenous Not Given 12/10/18 1216)  prochlorperazine (COMPAZINE) injection 5 mg (5 mg Intravenous Given 12/10/18 1215)  meclizine (ANTIVERT) tablet 25 mg (25 mg Oral Given 12/10/18 1132)  sodium chloride 0.9 % bolus 1,000 mL (1,000 mLs Intravenous New Bag/Given 12/10/18 1215)  diphenhydrAMINE (BENADRYL) injection 12.5 mg (12.5 mg Intravenous Given 12/10/18 1216)     Initial Impression / Assessment and Plan / ED Course  I have reviewed the triage vital signs and the nursing notes.  Pertinent labs & imaging results that were available during my care of the patient were reviewed by me and considered in my medical decision making (see chart for details).        Derek Atkins is a 73 year old male with a past medical history of diabetes and polymyalgia rheumatica who presents with a 2 to 3-day history of worsening dizziness in the setting of 2 weeks of cough and subjective  fever who is HDS with bilateral end gaze nsytagmus on exam.  Patient symptoms are consistent with vertigo given symptoms are worse with movement and patient has associated nausea vomiting.  However, given patient has multidirectional nystagmus on exam, there is concern for possible stroke for which we will obtain MRI.  Lightheadedness can also be secondary to cardiac dysfunction, however his description of his dizziness is more consistent with vertigo and syncope.  He does not report feeling dizzy upon standing but with movement.  EKG was normal.  We will obtain MRI brain without contrast and treat patient's symptoms with meclizine, Compazine, Benadryl and follow-up imaging results and symptoms.  ---  Patient with unremarkable MRI brain  but without sx improvement with medicines. Per Dr. Laurence Slate with try 1 mg ativan.  Final Clinical Impressions(s) / ED Diagnoses   Final diagnoses:  Vertigo   Normal MRI. Sx improved with ativan. Patient able to ambulate. DC with ENT and PCP follow up and meclizine prescription.   ED Discharge Orders    None       Jenell Milliner, MD 12/10/18 1438    Melene Plan, DO 12/10/18 1457

## 2018-12-10 NOTE — ED Notes (Signed)
Patient is resting comfortably. 

## 2019-01-11 ENCOUNTER — Other Ambulatory Visit (INDEPENDENT_AMBULATORY_CARE_PROVIDER_SITE_OTHER): Payer: Self-pay | Admitting: Otolaryngology

## 2019-02-22 ENCOUNTER — Ambulatory Visit: Payer: Medicare Other | Attending: Internal Medicine

## 2019-02-22 DIAGNOSIS — Z23 Encounter for immunization: Secondary | ICD-10-CM

## 2019-02-22 NOTE — Progress Notes (Signed)
   Covid-19 Vaccination Clinic  Name:  Derek Atkins    MRN: [id number] DOB: 1945-03-22  02/22/2019  [name] was observed post Covid-19 immunization for 15 minutes without incidence. He was provided with Vaccine Information Sheet and instruction to access the V-Safe system.   Mr. Flight was instructed to call 911 with any severe reactions post vaccine: Marland Kitchen Difficulty breathing  . Swelling of your face and throat  . A fast heartbeat  . A bad rash all over your body  . Dizziness and weakness    Immunizations Administered    Name Date Dose VIS Date Route   Pfizer COVID-19 Vaccine 02/22/2019  2:02 PM 0.3 mL 01/11/2019 Intramuscular   Manufacturer: [employer], [employer]   Lot: [id number]   NDC: [address]-9

## 2019-03-12 ENCOUNTER — Ambulatory Visit: Payer: Medicare Other | Attending: Internal Medicine

## 2019-03-12 DIAGNOSIS — Z23 Encounter for immunization: Secondary | ICD-10-CM | POA: Insufficient documentation

## 2019-03-12 NOTE — Progress Notes (Signed)
   Covid-19 Vaccination Clinic  Name:  Derek Atkins    MRN: [id number] DOB: July 16, 1945  03/12/2019  [name] was observed post Covid-19 immunization for 15 minutes without incidence. He was provided with Vaccine Information Sheet and instruction to access the V-Safe system.   [name] was instructed to call 911 with any severe reactions post vaccine: Marland Kitchen Difficulty breathing  . Swelling of your face and throat  . A fast heartbeat  . A bad rash all over your body  . Dizziness and weakness    Immunizations Administered    Name Date Dose VIS Date Route   Pfizer COVID-19 Vaccine 03/12/2019  1:35 PM 0.3 mL 01/11/2019 Intramuscular   Manufacturer: [employer], [employer]   Lot: [id number]   NDC: [address]-5

## 2019-08-16 ENCOUNTER — Other Ambulatory Visit: Payer: Self-pay | Admitting: Family Medicine

## 2019-08-16 DIAGNOSIS — Z79899 Other long term (current) drug therapy: Secondary | ICD-10-CM

## 2021-06-08 ENCOUNTER — Encounter (HOSPITAL_COMMUNITY): Payer: Self-pay | Admitting: Family Medicine

## 2021-06-08 ENCOUNTER — Observation Stay (HOSPITAL_COMMUNITY)
Admission: EM | Admit: 2021-06-08 | Discharge: 2021-06-09 | Disposition: A | Discharge status: Home / Self Care | Payer: Medicare Other | Attending: Family Medicine | Admitting: Family Medicine

## 2021-06-08 ENCOUNTER — Emergency Department (HOSPITAL_COMMUNITY): Payer: Medicare Other

## 2021-06-08 DIAGNOSIS — E1122 Type 2 diabetes mellitus with diabetic chronic kidney disease: Secondary | ICD-10-CM | POA: Insufficient documentation

## 2021-06-08 DIAGNOSIS — Z8673 Personal history of transient ischemic attack (TIA), and cerebral infarction without residual deficits: Secondary | ICD-10-CM | POA: Diagnosis not present

## 2021-06-08 DIAGNOSIS — Z79899 Other long term (current) drug therapy: Secondary | ICD-10-CM | POA: Diagnosis not present

## 2021-06-08 DIAGNOSIS — E119 Type 2 diabetes mellitus without complications: Secondary | ICD-10-CM | POA: Diagnosis not present

## 2021-06-08 DIAGNOSIS — N182 Chronic kidney disease, stage 2 (mild): Secondary | ICD-10-CM

## 2021-06-08 DIAGNOSIS — M353 Polymyalgia rheumatica: Secondary | ICD-10-CM

## 2021-06-08 DIAGNOSIS — E785 Hyperlipidemia, unspecified: Secondary | ICD-10-CM | POA: Diagnosis not present

## 2021-06-08 DIAGNOSIS — I639 Cerebral infarction, unspecified: Principal | ICD-10-CM

## 2021-06-08 DIAGNOSIS — E1169 Type 2 diabetes mellitus with other specified complication: Secondary | ICD-10-CM | POA: Insufficient documentation

## 2021-06-08 DIAGNOSIS — R200 Anesthesia of skin: Secondary | ICD-10-CM | POA: Diagnosis present

## 2021-06-08 LAB — COMPREHENSIVE METABOLIC PANEL
ALT: 21 U/L (ref 0–44)
AST: 19 U/L (ref 15–41)
Albumin: 3.6 g/dL (ref 3.5–5.0)
Alkaline Phosphatase: 54 U/L (ref 38–126)
Anion gap: 6 (ref 5–15)
BUN: 15 mg/dL (ref 8–23)
CO2: 24 mmol/L (ref 22–32)
Calcium: 9.2 mg/dL (ref 8.9–10.3)
Chloride: 106 mmol/L (ref 98–111)
Creatinine, Ser: 1.3 mg/dL — ABNORMAL HIGH (ref 0.61–1.24)
GFR, Estimated: 57 mL/min — ABNORMAL LOW (ref >60)
Glucose, Bld: 112 mg/dL — ABNORMAL HIGH (ref 70–99)
Potassium: 4.1 mmol/L (ref 3.5–5.1)
Sodium: 136 mmol/L (ref 135–145)
Total Bilirubin: 0.9 mg/dL (ref 0.3–1.2)
Total Protein: 6.9 g/dL (ref 6.5–8.1)

## 2021-06-08 LAB — CBC
HCT: 45.5 % (ref 39.0–52.0)
Hemoglobin: 15.3 g/dL (ref 13.0–17.0)
MCH: 30.1 pg (ref 26.0–34.0)
MCHC: 33.6 g/dL (ref 30.0–36.0)
MCV: 89.4 fL (ref 80.0–100.0)
Platelets: 208 10*3/uL (ref 150–400)
RBC: 5.09 MIL/uL (ref 4.22–5.81)
RDW: 13.7 % (ref 11.5–15.5)
WBC: 7.3 10*3/uL (ref 4.0–10.5)
nRBC: 0 % (ref 0.0–0.2)

## 2021-06-08 LAB — DIFFERENTIAL
Abs Immature Granulocytes: 0.05 10*3/uL (ref 0.00–0.07)
Basophils Absolute: 0.1 10*3/uL (ref 0.0–0.1)
Basophils Relative: 1 %
Eosinophils Absolute: 0.1 10*3/uL (ref 0.0–0.5)
Eosinophils Relative: 2 %
Immature Granulocytes: 1 %
Lymphocytes Relative: 30 %
Lymphs Abs: 2.2 10*3/uL (ref 0.7–4.0)
Monocytes Absolute: 0.6 10*3/uL (ref 0.1–1.0)
Monocytes Relative: 9 %
Neutro Abs: 4.2 10*3/uL (ref 1.7–7.7)
Neutrophils Relative %: 57 %

## 2021-06-08 LAB — URINALYSIS, ROUTINE W REFLEX MICROSCOPIC
Bilirubin Urine: NEGATIVE
Glucose, UA: NEGATIVE mg/dL
Hgb urine dipstick: NEGATIVE
Ketones, ur: 5 mg/dL — AB
Leukocytes,Ua: NEGATIVE
Nitrite: NEGATIVE
Protein, ur: NEGATIVE mg/dL
Specific Gravity, Urine: 1.024 (ref 1.005–1.030)
pH: 5 (ref 5.0–8.0)

## 2021-06-08 LAB — APTT: aPTT: 31 seconds (ref 24–36)

## 2021-06-08 LAB — RAPID URINE DRUG SCREEN, HOSP PERFORMED
Amphetamines: NOT DETECTED
Barbiturates: NOT DETECTED
Benzodiazepines: NOT DETECTED
Cocaine: NOT DETECTED
Opiates: NOT DETECTED
Tetrahydrocannabinol: NOT DETECTED

## 2021-06-08 LAB — I-STAT CHEM 8, ED
BUN: 18 mg/dL (ref 8–23)
Calcium, Ion: 1.03 mmol/L — ABNORMAL LOW (ref 1.15–1.40)
Chloride: 106 mmol/L (ref 98–111)
Creatinine, Ser: 1.2 mg/dL (ref 0.61–1.24)
Glucose, Bld: 110 mg/dL — ABNORMAL HIGH (ref 70–99)
HCT: 47 % (ref 39.0–52.0)
Hemoglobin: 16 g/dL (ref 13.0–17.0)
Potassium: 4.2 mmol/L (ref 3.5–5.1)
Sodium: 135 mmol/L (ref 135–145)
TCO2: 23 mmol/L (ref 22–32)

## 2021-06-08 LAB — ETHANOL: Alcohol, Ethyl (B): <10 mg/dL (ref <10)

## 2021-06-08 LAB — PROTIME-INR
INR: 1 (ref 0.8–1.2)
Prothrombin Time: 13.4 seconds (ref 11.4–15.2)

## 2021-06-08 MED ORDER — ACETAMINOPHEN 160 MG/5ML PO SOLN
650.0000 mg | ORAL | Status: DC | PRN
Start: 2021-06-08 — End: 2021-06-09

## 2021-06-08 MED ORDER — CLOPIDOGREL BISULFATE 75 MG PO TABS
75.0000 mg | ORAL_TABLET | Freq: Every day | ORAL | Status: DC
Start: 2021-06-09 — End: 2021-06-09
  Administered 2021-06-09: 75 mg via ORAL
  Filled 2021-06-08: qty 1

## 2021-06-08 MED ORDER — INSULIN ASPART 100 UNIT/ML IJ SOLN: 0.0000 [IU] | Freq: Three times a day (TID) | INTRAMUSCULAR | Status: DC

## 2021-06-08 MED ORDER — PREDNISONE 1 MG PO TABS
1.0000 mg | ORAL_TABLET | Freq: Every day | ORAL | Status: DC
  Administered 2021-06-09: 1 mg via ORAL
  Filled 2021-06-08 (×2): qty 1

## 2021-06-08 MED ORDER — ACETAMINOPHEN 325 MG PO TABS: 650.0000 mg | ORAL_TABLET | ORAL | Status: DC | PRN

## 2021-06-08 MED ORDER — PRAVASTATIN SODIUM 10 MG PO TABS: 20.0000 mg | ORAL_TABLET | ORAL | Status: DC

## 2021-06-08 MED ORDER — ASPIRIN 81 MG PO CHEW
81.0000 mg | CHEWABLE_TABLET | Freq: Once | ORAL | Status: AC
  Administered 2021-06-08: 81 mg via ORAL
  Filled 2021-06-08: qty 1

## 2021-06-08 MED ORDER — CLOPIDOGREL BISULFATE 75 MG PO TABS
75.0000 mg | ORAL_TABLET | Freq: Once | ORAL | Status: AC
  Administered 2021-06-08: 75 mg via ORAL
  Filled 2021-06-08: qty 1

## 2021-06-08 MED ORDER — STROKE: EARLY STAGES OF RECOVERY BOOK: Freq: Once | Status: DC

## 2021-06-08 MED ORDER — SODIUM CHLORIDE 0.9 % IV SOLN: INTRAVENOUS | Status: AC

## 2021-06-08 MED ORDER — ACETAMINOPHEN 650 MG RE SUPP: 650.0000 mg | RECTAL | Status: DC | PRN

## 2021-06-08 MED ORDER — ASPIRIN 325 MG PO TABS: 325.0000 mg | ORAL_TABLET | Freq: Once | ORAL | Status: DC

## 2021-06-08 MED ORDER — SENNOSIDES-DOCUSATE SODIUM 8.6-50 MG PO TABS: 1.0000 | ORAL_TABLET | Freq: Every evening | ORAL | Status: DC | PRN

## 2021-06-08 MED ORDER — ENOXAPARIN SODIUM 40 MG/0.4ML IJ SOSY
40.0000 mg | PREFILLED_SYRINGE | INTRAMUSCULAR | Status: DC
  Administered 2021-06-09: 40 mg via SUBCUTANEOUS
  Filled 2021-06-08: qty 0.4

## 2021-06-08 MED ORDER — ASPIRIN EC 81 MG PO TBEC
81.0000 mg | DELAYED_RELEASE_TABLET | Freq: Every day | ORAL | Status: DC
  Administered 2021-06-09: 81 mg via ORAL
  Filled 2021-06-08: qty 1

## 2021-06-08 NOTE — ED Provider Notes (Signed)
?[facility] EMERGENCY DEPARTMENT ?Provider Note ? ? ?CSN: [id number] ?Arrival date & time: 06/08/21  1629 ? ?  ? ?History ? ?Chief Complaint  ?Patient presents with  ? numbness in fingers and left check  ? Weakness  ? ? ?Derek Atkins is a 76 y.o. male here with numbness of left cheek and L finger numbness.  Patient states that yesterday he had intermittent left cheek numbness since yesterday and intermittent left finger numbness.  Patient states that today the numbness is more constant.  Denies any trouble speaking.  Patient states that he is on low-dose prednisone chronically.  Patient has a history of TIA in the past but not on any aspirin or Plavix currently. ? ?The history is provided by the patient.  ? ?  ? ?Home Medications ?Prior to Admission medications   ?Medication Sig Start Date End Date Taking? Authorizing Provider  ?meclizine (ANTIVERT) 12.5 MG tablet Take 1 tablet (12.5 mg total) by mouth 3 (three) times daily as needed for dizziness. 12/10/18   Jenell Milliner, MD  ?predniSONE (DELTASONE) 5 MG tablet Take 1 mg by mouth daily with breakfast.     [provider]  ?tamsulosin (FLOMAX) 0.4 MG CAPS capsule Take 1 capsule by mouth at bedtime. 08/01/17   [provider]  ?   ? ?Allergies    ?Patient has no known allergies.   ? ?Review of Systems   ?Review of Systems  ?Neurological:  Positive for numbness.  ?All other systems reviewed and are negative. ? ?Physical Exam ?Updated Vital Signs ?BP (!) 159/72   Pulse 68   Resp 19   Ht 5\' 8"  (1.727 m)   Wt 84.4 kg   SpO2 98%   BMI 28.28 kg/m?  ?Physical Exam ?Vitals and nursing note reviewed.  ?Constitutional:   ?   Appearance: Normal appearance.  ?HENT:  ?   Head: Normocephalic.  ?   Nose: Nose normal.  ?   Mouth/Throat:  ?   Mouth: Mucous membranes are moist.  ?Eyes:  ?   Extraocular Movements: Extraocular movements intact.  ?   Pupils: Pupils are equal, round, and reactive to light.  ?Cardiovascular:  ?   Rate and Rhythm:  Normal rate and regular rhythm.  ?   Pulses: Normal pulses.  ?   Heart sounds: Normal heart sounds.  ?Pulmonary:  ?   Effort: Pulmonary effort is normal.  ?   Breath sounds: Normal breath sounds.  ?Abdominal:  ?   General: Abdomen is flat.  ?   Palpations: Abdomen is soft.  ?Musculoskeletal:     ?   General: Normal range of motion.  ?   Cervical back: Normal range of motion and neck supple.  ?Skin: ?   General: Skin is warm.  ?   Capillary Refill: Capillary refill takes less than 2 seconds.  ?Neurological:  ?   Mental Status: He is alert.  ?   Comments: L facial droop.  Slightly decreased sensation over the left cheek area.  Patient is able to open eyes on the left side.  Patient has normal sensation bilateral arms and legs and normal gait and normal motor  ?Psychiatric:     ?   Mood and Affect: Mood normal.     ?   Behavior: Behavior normal.  ? ? ?ED Results / Procedures / Treatments   ?Labs ?(all labs ordered are listed, but only abnormal results are displayed) ?Labs Reviewed  ?COMPREHENSIVE METABOLIC PANEL - Abnormal; Notable for  the following components:  ?    Result Value  ? Glucose, Bld 112 (*)   ? Creatinine, Ser 1.30 (*)   ? GFR, Estimated 57 (*)   ? All other components within normal limits  ?URINALYSIS, ROUTINE W REFLEX MICROSCOPIC - Abnormal; Notable for the following components:  ? Ketones, ur 5 (*)   ? All other components within normal limits  ?I-STAT CHEM 8, ED - Abnormal; Notable for the following components:  ? Glucose, Bld 110 (*)   ? Calcium, Ion 1.03 (*)   ? All other components within normal limits  ?RESP PANEL BY RT-PCR (FLU A&B, COVID) ARPGX2  ?ETHANOL  ?PROTIME-INR  ?APTT  ?CBC  ?DIFFERENTIAL  ?RAPID URINE DRUG SCREEN, HOSP PERFORMED  ? ? ?EKG ?None ? ?Radiology ?CT HEAD WO CONTRAST ( ) ? ?Result Date: 06/08/2021 ?CLINICAL DATA:  Left hand numbness EXAM: CT HEAD WITHOUT CONTRAST TECHNIQUE: Contiguous axial images were obtained from the base of the skull through the vertex without intravenous  contrast. RADIATION DOSE REDUCTION: This exam was performed according to the departmental dose-optimization program which includes automated exposure control, adjustment of the mA and/or kV according to patient size and/or use of iterative reconstruction technique. COMPARISON:  10/20/2017 FINDINGS: Brain: No evidence of acute infarction, hemorrhage, cerebral edema, mass, mass effect, or midline shift. No hydrocephalus or extra-axial fluid collection. Remote lacunar infarct in the right basal ganglia. Vascular: No hyperdense vessel. Atherosclerotic calcifications in the intracranial carotid and vertebral arteries. Skull: Normal. Negative for fracture or focal lesion. Sinuses/Orbits: Bubbly fluid in the bilateral maxillary sinuses and right sphenoid sinus. Mucosal thickening in the maxillary sinuses and ethmoid air cells. The orbits are unremarkable. Other: The mastoid air cells are well aerated. IMPRESSION: 1.  No acute intracranial process. 2. Bubbly fluid in the maxillary sinuses and right sphenoid sinus, as can be seen with acute sinusitis. Correlate with symptoms. Electronically Signed   By: Wiliam Ke M.D.   On: 06/08/2021 18:45   ? ?Procedures ?Procedures  ? ? ?Medications Ordered in ED ?Medications - No data to display ? ?ED Course/ Medical Decision Making/ A&P ?  ?                        ?Medical Decision Making ?Derek Atkins is a 76 y.o. male here presenting with possible left facial droop and left facial numbness.  Likely Bell's palsy versus small stroke or TIA.  We will get MRI brain and labs.  ? ?9:10 PM ?MRI showed small acute infarct in R thalamus.  Consulted Dr. Derry Lory from neurology.  He recommended aspirin and Plavix and hospitalist admission.  I updated the patient.  ? ?Problems Addressed: ?Cerebrovascular accident (CVA), unspecified mechanism (HCC): acute illness or injury ? ?Amount and/or Complexity of Data Reviewed ?Labs: ordered. Decision-making details documented in ED  Course. ?Radiology: ordered and independent interpretation performed. Decision-making details documented in ED Course. ?ECG/medicine tests: ordered and independent interpretation performed. Decision-making details documented in ED Course. ? ?Risk ?OTC drugs. ?Prescription drug management. ?Decision regarding hospitalization. ? ?Final Clinical Impression(s) / ED Diagnoses ?Final diagnoses:  ?None  ? ? ?Rx / DC Orders ?ED Discharge Orders   ? ? None  ? ?  ? ? ?  ?Charlynne Pander, MD ?06/08/21 2114 ? ?

## 2021-06-08 NOTE — H&P (Signed)
?History and Physical  ? ? ?Derek BullionWilliam H Babington ZOX:[id number] DOB: 09/18/1945 DOA: 06/08/2021 ? ?PCP: Daisy Florooss, Charles Alan, MD  ? ?Patient coming from:  Home  ? ?Chief Complaint: Left cheek and hand numbness  ? ?HPI: Derek Atkins is a pleasant 76 y.o. male with medical history significant for polymyalgia rheumatica and diet-controlled diabetes mellitus, presented to the emergency department for evaluation of numbness involving his left cheek and left hand.  Patient reports he been in his usual state of health until yesterday when he had 5 episodes of numbness involving the left cheek and left hand, lasting approximately 30 seconds each.  He then had further left cheek and hand numbness today that prompted his presentation to the ED.  Symptoms have resolved by time of interview in the ED.  He denies any headache, change in vision or hearing, weakness, chest pain, or palpitations. ? ?ED Course: Upon arrival to the ED, patient is found to be saturating well on room air with normal heart rate and stable blood pressure.  Head CT is negative for acute intracranial abnormality.  MRI brain revealed small acute infarction in the lateral right thalamus.  Chemistry panel notable for creatinine 1.30.  Neurology was consulted by the ED physician and the patient was treated with aspirin and Plavix. ? ?Review of Systems:  ?All other systems reviewed and apart from HPI, are negative. ? ?Past Medical History:  ?Diagnosis Date  ? Diabetes mellitus without complication (HCC)   ? PMR (polymyalgia rheumatica) (HCC)   ? ? ?Past Surgical History:  ?Procedure Laterality Date  ? NO PAST SURGERIES    ? ? ?Social History:  ? reports that he has never smoked. He has never used smokeless tobacco. He reports current alcohol use. He reports that he does not use drugs. ? ?No Known Allergies ? ?Family History  ?Problem Relation Age of Onset  ? Hyperlipidemia Other   ? ? ? ?Prior to Admission medications   ?Medication Sig Start Date End Date Taking?  Authorizing Provider  ?ezetimibe (ZETIA) 10 MG tablet Take 10 mg by mouth daily. 04/13/21  Yes [provider]  ?pravastatin (PRAVACHOL) 20 MG tablet Take 20 mg by mouth once a week. 06/08/21  Yes [provider]  ?predniSONE (DELTASONE) 5 MG tablet Take 1 mg by mouth daily with breakfast.    Yes [provider]  ?meclizine (ANTIVERT) 12.5 MG tablet Take 1 tablet (12.5 mg total) by mouth 3 (three) times daily as needed for dizziness. ?Patient not taking: Reported on 06/08/2021 12/10/18   Jenell MillinerLanier, Claire, MD  ? ? ?Physical Exam: ?Vitals:  ? 06/08/21 1657 06/08/21 1706 06/08/21 1945  ?BP: (!) 135/9  (!) 159/72  ?Pulse: 85  68  ?Resp: 20  19  ?SpO2: 96%  98%  ?Weight:  84.4 kg   ?Height:  5\' 8"  (1.727 m)   ? ? ?Constitutional: NAD, calm  ?Eyes: PERTLA, lids and conjunctivae normal ?ENMT: Mucous membranes are moist. Posterior pharynx clear of any exudate or lesions.   ?Neck: supple, no masses  ?Respiratory:  no wheezing, no crackles. No accessory muscle use.  ?Cardiovascular: S1 & S2 heard, regular rate and rhythm. No extremity edema.   ?Abdomen: No distension, no tenderness, soft. Bowel sounds active.  ?Musculoskeletal: no clubbing / cyanosis. No joint deformity upper and lower extremities.   ?Skin: no significant rashes, lesions, ulcers. Warm, dry, well-perfused. ?Neurologic: CN 2-12 grossly intact. Sensation intact, DTR normal. Strength 5/5 in all 4 limbs. Alert and oriented.  ?Psychiatric:  Pleasant. Cooperative.  ? ? ?Labs and Imaging on Admission: I have personally reviewed following labs and imaging studies ? ?CBC: ?Recent Labs  ?Lab 06/08/21 ?1725 06/08/21 ?1757  ?WBC 7.3  --   ?NEUTROABS 4.2  --   ?HGB 15.3 16.0  ?HCT 45.5 47.0  ?MCV 89.4  --   ?PLT 208  --   ? ?Basic Metabolic Panel: ?Recent Labs  ?Lab 06/08/21 ?1725 06/08/21 ?1757  ?NA 136 135  ?K 4.1 4.2  ?CL 106 106  ?CO2 24  --   ?GLUCOSE 112* 110*  ?BUN 15 18  ?CREATININE 1.30* 1.20  ?CALCIUM 9.2  --   ? ?GFR: ?Estimated Creatinine  Clearance: 55.4 mL/min (by C-G formula based on SCr of 1.2 mg/dL). ?Liver Function Tests: ?Recent Labs  ?Lab 06/08/21 ?1725  ?AST 19  ?ALT 21  ?ALKPHOS 54  ?BILITOT 0.9  ?PROT 6.9  ?ALBUMIN 3.6  ? ?No results for input(s): LIPASE, AMYLASE in the last 168 hours. ?No results for input(s): AMMONIA in the last 168 hours. ?Coagulation Profile: ?Recent Labs  ?Lab 06/08/21 ?1725  ?INR 1.0  ? ?Cardiac Enzymes: ?No results for input(s): CKTOTAL, CKMB, CKMBINDEX, TROPONINI in the last 168 hours. ?BNP (last 3 results) ?No results for input(s): PROBNP in the last 8760 hours. ?HbA1C: ?No results for input(s): HGBA1C in the last 72 hours. ?CBG: ?No results for input(s): GLUCAP in the last 168 hours. ?Lipid Profile: ?No results for input(s): CHOL, HDL, LDLCALC, TRIG, CHOLHDL, LDLDIRECT in the last 72 hours. ?Thyroid Function Tests: ?No results for input(s): TSH, T4TOTAL, FREET4, T3FREE, THYROIDAB in the last 72 hours. ?Anemia Panel: ?No results for input(s): VITAMINB12, FOLATE, FERRITIN, TIBC, IRON, RETICCTPCT in the last 72 hours. ?Urine analysis: ?   ?Component Value Date/Time  ? COLORURINE YELLOW 06/08/2021 1729  ? APPEARANCEUR CLEAR 06/08/2021 1729  ? LABSPEC 1.024 06/08/2021 1729  ? PHURINE 5.0 06/08/2021 1729  ? GLUCOSEU NEGATIVE 06/08/2021 1729  ? HGBUR NEGATIVE 06/08/2021 1729  ? BILIRUBINUR NEGATIVE 06/08/2021 1729  ? KETONESUR 5 (A) 06/08/2021 1729  ? PROTEINUR NEGATIVE 06/08/2021 1729  ? NITRITE NEGATIVE 06/08/2021 1729  ? LEUKOCYTESUR NEGATIVE 06/08/2021 1729  ? ?Sepsis Labs: ?@LABRCNTIP (procalcitonin:4,lacticidven:4) ?)No results found for this or any previous visit (from the past 240 hour(s)).  ? ?Radiological Exams on Admission: ?CT HEAD WO CONTRAST ( ) ? ?Result Date: 06/08/2021 ?CLINICAL DATA:  Left hand numbness EXAM: CT HEAD WITHOUT CONTRAST TECHNIQUE: Contiguous axial images were obtained from the base of the skull through the vertex without intravenous contrast. RADIATION DOSE REDUCTION: This exam was  performed according to the departmental dose-optimization program which includes automated exposure control, adjustment of the mA and/or kV according to patient size and/or use of iterative reconstruction technique. COMPARISON:  10/20/2017 FINDINGS: Brain: No evidence of acute infarction, hemorrhage, cerebral edema, mass, mass effect, or midline shift. No hydrocephalus or extra-axial fluid collection. Remote lacunar infarct in the right basal ganglia. Vascular: No hyperdense vessel. Atherosclerotic calcifications in the intracranial carotid and vertebral arteries. Skull: Normal. Negative for fracture or focal lesion. Sinuses/Orbits: Bubbly fluid in the bilateral maxillary sinuses and right sphenoid sinus. Mucosal thickening in the maxillary sinuses and ethmoid air cells. The orbits are unremarkable. Other: The mastoid air cells are well aerated. IMPRESSION: 1.  No acute intracranial process. 2. Bubbly fluid in the maxillary sinuses and right sphenoid sinus, as can be seen with acute sinusitis. Correlate with symptoms. Electronically Signed   By: 10/22/2017 M.D.   On: 06/08/2021 18:45  ? ?MR BRAIN  WO CONTRAST ? ?Result Date: 06/08/2021 ?CLINICAL DATA:  Transient ischemic attack.  Left hand numbness. EXAM: MRI HEAD WITHOUT CONTRAST TECHNIQUE: Multiplanar, multiecho pulse sequences of the brain and surrounding structures were obtained without intravenous contrast. COMPARISON:  Head CT same day.  MRI 12/10/2018. FINDINGS: Brain: Diffusion imaging shows a punctate acute infarction of the lateral right thalamus. This is adjacent to an old punctate infarction. Otherwise, the brainstem and cerebellum are normal. Cerebral hemispheres show mild chronic small-vessel ischemic change of the white matter. No large vessel territory infarction. No mass, hemorrhage, hydrocephalus or extra-axial collection. Vascular: Major vessels at the base of the brain show flow. Skull and upper cervical spine: Negative Sinuses/Orbits: Mucosal  inflammatory disease of the paranasal sinuses, most pronounced affecting the maxillary sinuses. Orbits negative. Other: None IMPRESSION: Small acute infarction in the lateral right thalamus. Adjacent small old infarc

## 2021-06-08 NOTE — ED Provider Triage Note (Signed)
Emergency Medicine Provider Triage Evaluation Note ? ?Derek Atkins , a 76 y.o. male  was evaluated in triage.  Pt complains of left hand tingling (denies numbness) and left cheek tingling (not numb) since yesterday states it's been intermittent. Currently no sx. No headache headtrauma neck trauma or hx of spinal stenosis/cervical radic. ? ?NO hx of stroke.  ? ?Not on DOAC/anticoag. No hx of afib, heart disease.  ? ? ?Review of Systems  ?Positive: paresthesias ?Negative: Fever  ? ?Physical Exam  ?BP (!) 135/9   Pulse 85   Resp 20   Ht 5\' 8"  (1.727 m)   Wt 84.4 kg   SpO2 96%   BMI 28.28 kg/m?  ?Gen:   Awake, no distress   ?Resp:  Normal effort  ?MSK:   Moves extremities without difficulty  ?Other:   ? ?Alert and oriented to self, place, time and event.  ? ?Speech is fluent, clear without dysarthria or dysphasia.  ? ?Strength 5/5 in upper/lower extremities   ?Sensation intact in upper/lower extremities  ? ?Normal gait.  ?Negative Romberg. No pronator drift.  ?Normal finger-to-nose and feet tapping.  ?CN I not tested  ?CN II grossly intact visual fields bilaterally. Did not visualize posterior eye.  ?CN III, IV, VI PERRLA and EOMs intact bilaterally  ?CN V Intact sensation to sharp and light touch to the face  ?CN VII facial movements symmetric  ?CN VIII not tested  ?CN IX, X no uvula deviation, symmetric rise of soft palate  ?CN XI 5/5 SCM and trapezius strength bilaterally  ?CN XII Midline tongue protrusion, symmetric L/R movements   ? ? ?No numbness. Seems to be experiencing + symptoms of tingling ? ?Medical Decision Making  ?Medically screening exam initiated at 5:18 PM.  Appropriate orders placed.  Derek Atkins was informed that the remainder of the evaluation will be completed by another provider, this initial triage assessment does not replace that evaluation, and the importance of remaining in the ED until their evaluation is complete. ? ?Labs, ct head ?  ?Tedd Sias, [place] ?06/08/21 1720 ? ?

## 2021-06-08 NOTE — Consult Note (Signed)
NEUROLOGY CONSULTATION NOTE  ? ?Date of service: Jun 08, 2021 ?Patient Name: Derek Atkins ?MRN:  [id number] ?DOB:  Jun 19, 1945 ?Reason for consult: "stroke on MRI" ?Requesting Provider: Vianne Bulls, MD ?_ _ _   _ __   _ __ _ _  __ __   _ __   __ _ ? ?History of Present Illness  ?Derek Atkins is a 76 y.o. male with PMH significant for DM2, Polymyalgia rheumatica, HLD who presents with L hand and L cheek numbness since yesterday. It was intermitten initially and had 5 episodes lasting 5-10 mins and is  now persistent since 1000AM in the morning. Eventually came to the ED for further evaluation. ? ?LKW: 1000 on 06/07/21. ?mRS: 0 ?tNKASE: not offered, too mild to treat ?Thrombectomy: not offered, low suspicion for LVO. ?NIHSS components Score: Comment  ?1a Level of Conscious 0[x]  1[]  2[]  3[]      ?1b LOC Questions 0[x]  1[]  2[]       ?1c LOC Commands 0[x]  1[]  2[]       ?2 Best Gaze 0[x]  1[]  2[]       ?3 Visual 0[x]  1[]  2[]  3[]      ?4 Facial Palsy 0[x]  1[]  2[]  3[]      ?5a Motor Arm - left 0[x]  1[]  2[]  3[]  4[]  UN[]    ?5b Motor Arm - Right 0[x]  1[]  2[]  3[]  4[]  UN[]    ?6a Motor Leg - Left 0[x]  1[]  2[]  3[]  4[]  UN[]    ?6b Motor Leg - Right 0[x]  1[]  2[]  3[]  4[]  UN[]    ?7 Limb Ataxia 0[x]  1[]  2[]  3[]  UN[]     ?8 Sensory 0[x]  1[]  2[]  UN[]      ?9 Best Language 0[x]  1[]  2[]  3[]      ?10 Dysarthria 0[x]  1[]  2[]  UN[]      ?11 Extinct. and Inattention 0[x]  1[]  2[]       ?TOTAL: 0   ? ?  ?ROS  ? ?Constitutional Denies weight loss, fever and chills.   ?HEENT Denies changes in vision and hearing.   ?Respiratory Denies SOB and cough.   ?CV Denies palpitations and CP   ?GI Denies abdominal pain, nausea, vomiting and diarrhea.   ?GU Denies dysuria and urinary frequency.   ?MSK Denies myalgia and joint pain.   ?Skin Denies rash and pruritus.   ?Neurological Denies headache and syncope.   ?Psychiatric Denies recent changes in mood. Denies anxiety and depression.   ? ?Past History  ? ?Past Medical History:  ?Diagnosis Date  ? Diabetes mellitus  without complication ([place])   ? PMR (polymyalgia rheumatica) ([facility])   ? ?Past Surgical History:  ?Procedure Laterality Date  ? NO PAST SURGERIES    ? ?Family History  ?Problem Relation Age of Onset  ? Hyperlipidemia Other   ? ?Social History  ? ?Socioeconomic History  ? Marital status: Married  ?  Spouse name: Derek Atkins  ? Number of children: Not on file  ? Years of education: Not on file  ? Highest education level: Not on file  ?Occupational History  ? Not on file  ?Tobacco Use  ? Smoking status: Never  ? Smokeless tobacco: Never  ?Vaping Use  ? Vaping Use: Never used  ?Substance and Sexual Activity  ? Alcohol use: Yes  ?  Comment: 1 glass wine per night  ? Drug use: Never  ? Sexual activity: Not on file  ?Other Topics Concern  ? Not on file  ?Social History Narrative  ? Lives with wife, Derek Atkins  ? Caffeine use: tea daily  ?  Diet coke daily  ? Right handed   ? ?Social Determinants of Health  ? ?Financial Resource Strain: Not on file  ?Food Insecurity: Not on file  ?Transportation Needs: Not on file  ?Physical Activity: Not on file  ?Stress: Not on file  ?Social Connections: Not on file  ? ?No Known Allergies ? ?Medications  ?(Not in a hospital admission) ?  ? ?Vitals  ? ?Vitals:  ? 06/08/21 1657 06/08/21 1706 06/08/21 1945  ?BP: (!) 135/9  (!) 159/72  ?Pulse: 85  68  ?Resp: 20  19  ?SpO2: 96%  98%  ?Weight:  84.4 kg   ?Height:  5\' 8"  (1.727 m)   ?  ? ?Body mass index is 28.28 kg/m?. ? ?Physical Exam  ? ?General: Laying comfortably in bed; in no acute distress.  ?HENT: Normal oropharynx and mucosa. Normal external appearance of ears and nose.  ?Neck: Supple, no pain or tenderness  ?CV: No JVD. No peripheral edema.  ?Pulmonary: Symmetric Chest rise. Normal respiratory effort.  ?Abdomen: Soft to touch, non-tender.  ?Ext: No cyanosis, edema, or deformity  ?Skin: No rash. Normal palpation of skin.   ?Musculoskeletal: Normal digits and nails by inspection. No clubbing.  ? ?Neurologic Examination  ?Mental status/Cognition: Alert,  oriented to self, place, month and year, good attention.  ?Speech/language: Fluent, comprehension intact, object naming intact, repetition intact.  ?Cranial nerves:  ? CN II Pupils equal and reactive to light, no VF deficits   ? CN III,IV,VI EOM intact, no gaze preference or deviation, no nystagmus   ? CN V normal sensation in V1, V2, and V3 segments bilaterally   ? CN VII no asymmetry, no nasolabial fold flattening   ? CN VIII normal hearing to speech   ? CN IX & X normal palatal elevation, no uvular deviation   ? CN XI 5/5 head turn and 5/5 shoulder shrug bilaterally   ? CN XII midline tongue protrusion   ? ?Motor:  ?Muscle bulk: normal, tone normal, pronator drift none tremor none ?Mvmt Root Nerve  Muscle Right Left Comments  ?SA C5/6 Ax Deltoid 5 5   ?EF C5/6 Mc Biceps 5 5   ?EE C6/7/8 Rad Triceps 5 5   ?WF C6/7 Med FCR     ?WE C7/8 PIN ECU     ?F Ab C8/T1 U ADM/FDI 5 5   ?HF L1/2/3 Fem Illopsoas 5 5   ?KE L2/3/4 Fem Quad 5 5   ?DF L4/5 D Peron Tib Ant 5 5   ?PF S1/2 Tibial Grc/Sol 5 5   ? ?Reflexes: ? Right Left Comments  ?Pectoralis     ? Biceps (C5/6) 2 2   ?Brachioradialis (C5/6) 2 2   ? Triceps (C6/7) 2 2   ? Patellar (L3/4) 2 2   ? Achilles (S1)     ? Hoffman     ? Plantar     ?Jaw jerk   ? ?Sensation: ? Light touch Intact throughout  ? Pin prick   ? Temperature   ? Vibration   ?Proprioception   ? ?Coordination/Complex Motor:  ?- Finger to Nose intact BL ?- Heel to shin intact BL ?- Rapid alternating movement are normal ?- Gait: deferred. ? ?Labs  ? ?CBC:  ?Recent Labs  ?Lab 06/08/21 ?1725 06/08/21 ?1757  ?WBC 7.3  --   ?NEUTROABS 4.2  --   ?HGB 15.3 16.0  ?HCT 45.5 47.0  ?MCV 89.4  --   ?PLT 208  --   ? ? ?Basic  Metabolic Panel:  ?Lab Results  ?Component Value Date  ? NA 135 06/08/2021  ? K 4.2 06/08/2021  ? CO2 24 06/08/2021  ? GLUCOSE 110 (H) 06/08/2021  ? BUN 18 06/08/2021  ? CREATININE 1.20 06/08/2021  ? CALCIUM 9.2 06/08/2021  ? GFRNONAA 57 (L) 06/08/2021  ? GFRAA >60 12/10/2018  ? ?Lipid Panel: No  results found for: [place] ?HgbA1c: No results found for: HGBA1C ?Urine Drug Screen:  ?   ?Component Value Date/Time  ? LABOPIA NONE DETECTED 06/08/2021 1629  ? COCAINSCRNUR NONE DETECTED 06/08/2021 1629  ? LABBENZ NONE DETECTED 06/08/2021 1629  ? AMPHETMU NONE DETECTED 06/08/2021 1629  ? THCU NONE DETECTED 06/08/2021 1629  ? LABBARB NONE DETECTED 06/08/2021 1629  ?  ?Alcohol Level  ?   ?Component Value Date/Time  ? ETH <10 06/08/2021 1725  ? ? ?CT Head without contrast(Personally reviewed): ?CTH was negative for a large hypodensity concerning for a large territory infarct or hyperdensity concerning for an ICH ? ?MR Angio head without contrast and Carotid Duplex BL: ?pending ? ?MRI Brain(Personally reviewed): ?R thalamus stroke. ? ?Impression  ? ?KENTO SATTAR is a 76 y.o. 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 His neurologic examination is notable for no deficit. ? ?Primary Diagnosis:  ?Other cerebral infarction due to occlusion of stenosis of small artery. ? ?Secondary Diagnosis: ?Type 2 diabetes mellitus w/o complications ? ?Recommendations  ? ?Recommend that primary team order following: ?- Frequent Neuro checks per stroke unit protocol ?- Recommend brain imaging with MRI Brain without contrast ?- Recommend Vascular imaging with MRA Angio Head without contrast and US Carotid doppler ?- Recommend obtaining TTE ?- Recommend obtaining Lipid panel with LDL ?- Please start statin if LDL > 70 ?- Recommend HbA1c ?- Antithrombotic - Aspirin 81mg  daily along with plavix 75mg  daily x 21 days followed by Aspirin 81mg  daily alone. ?- Recommend DVT ppx ?- SBP goal - permissive hypertension first 24 h < 220/110. Held home meds.  ?- Recommend Telemetry monitoring for arrythmia ?- Recommend bedside swallow screen prior to PO intake. ?- Stroke education booklet ?- Recommend PT/OT/SLP  consult ?______________________________________________________________________ ? ? ?Thank you for the opportunity to take part in the care of this patient. If you have any further questions, please contact the neurology consultation at

## 2021-06-08 NOTE — ED Notes (Signed)
Patient to MRI.

## 2021-06-08 NOTE — ED Triage Notes (Signed)
Pt. Stated, yesterday I had some numbness in my left check and my fingers about 4-5 times that lasted about 30 seconds and went away this was over about 3 hours.  Today its been one time after 10am, the numbness in my left cheek is still there. ? ?VAN - negative ?

## 2021-06-09 ENCOUNTER — Observation Stay (HOSPITAL_COMMUNITY): Payer: Medicare Other

## 2021-06-09 ENCOUNTER — Observation Stay (HOSPITAL_BASED_OUTPATIENT_CLINIC_OR_DEPARTMENT_OTHER): Payer: Medicare Other

## 2021-06-09 ENCOUNTER — Other Ambulatory Visit: Payer: Self-pay

## 2021-06-09 DIAGNOSIS — I639 Cerebral infarction, unspecified: Secondary | ICD-10-CM

## 2021-06-09 DIAGNOSIS — I6389 Other cerebral infarction: Secondary | ICD-10-CM | POA: Diagnosis not present

## 2021-06-09 LAB — CBC
HCT: 44.6 % (ref 39.0–52.0)
Hemoglobin: 15.1 g/dL (ref 13.0–17.0)
MCH: 30.1 pg (ref 26.0–34.0)
MCHC: 33.9 g/dL (ref 30.0–36.0)
MCV: 88.8 fL (ref 80.0–100.0)
Platelets: 194 10*3/uL (ref 150–400)
RBC: 5.02 MIL/uL (ref 4.22–5.81)
RDW: 13.6 % (ref 11.5–15.5)
WBC: 7.3 10*3/uL (ref 4.0–10.5)
nRBC: 0 % (ref 0.0–0.2)

## 2021-06-09 LAB — LIPID PANEL
Cholesterol: 187 mg/dL (ref 0–200)
HDL: 49 mg/dL (ref >40)
LDL Cholesterol: 125 mg/dL — ABNORMAL HIGH (ref 0–99)
Total CHOL/HDL Ratio: 3.8 RATIO
Triglycerides: 67 mg/dL (ref <150)
VLDL: 13 mg/dL (ref 0–40)

## 2021-06-09 LAB — ECHOCARDIOGRAM COMPLETE
AR max vel: 2.3 cm2
AV Area VTI: 2.32 cm2
AV Area mean vel: 2.32 cm2
AV Mean grad: 4 mmHg
AV Peak grad: 6.7 mmHg
Ao pk vel: 1.29 m/s
Area-P 1/2: 4.31 cm2
Height: 68 in
S' Lateral: 3.3 cm
Weight: 2976 oz

## 2021-06-09 LAB — CBG MONITORING, ED
Glucose-Capillary: 109 mg/dL — ABNORMAL HIGH (ref 70–99)
Glucose-Capillary: 100 mg/dL — ABNORMAL HIGH (ref 70–99)
Glucose-Capillary: 133 mg/dL — ABNORMAL HIGH (ref 70–99)

## 2021-06-09 LAB — HEMOGLOBIN A1C
Hgb A1c MFr Bld: 7 % — ABNORMAL HIGH (ref 4.8–5.6)
Mean Plasma Glucose: 154.2 mg/dL

## 2021-06-09 MED ORDER — ATORVASTATIN CALCIUM 40 MG PO TABS: 40.0000 mg | ORAL_TABLET | Freq: Every day | ORAL | 2 refills | Status: AC

## 2021-06-09 MED ORDER — PRAVASTATIN SODIUM 10 MG PO TABS: 20.0000 mg | ORAL_TABLET | Freq: Every day | ORAL | Status: DC

## 2021-06-09 MED ORDER — CLOPIDOGREL BISULFATE 75 MG PO TABS: 75.0000 mg | ORAL_TABLET | Freq: Every day | ORAL | 0 refills | Status: AC

## 2021-06-09 MED ORDER — ASPIRIN 81 MG PO TBEC: 81.0000 mg | DELAYED_RELEASE_TABLET | Freq: Every day | ORAL | 2 refills | Status: AC

## 2021-06-09 MED ORDER — SODIUM CHLORIDE 0.9 % IV BOLUS: 500.0000 mL | Freq: Once | INTRAVENOUS | Status: DC

## 2021-06-09 NOTE — Evaluation (Addendum)
Occupational Therapy Evaluation and Discharge ?Patient Details ?Name: Derek Atkins ?MRN: [id number] ?DOB: 06/20/1945 ?Today's Date: 06/09/2021 ? ? ?History of Present Illness Pt is a 76 year old man who presented on 06/08/21 with intermittent L cheek and hand numbness. MRI + small acute infarction lateral L thalamus. PMH: polymyalgia rheumatica, DM, CKD II.  ? ?Clinical Impression ?  ?Pt is functioning independently in ADLs and mobility. All symptoms for which he came to the ED have resolved. VSS throughout session on RA. Pt educated in s/s of CVA and importance of seeking medical evaluation quickly. Pt verbalized understanding. ?   ? ?Recommendations for follow up therapy are one component of a multi-disciplinary discharge planning process, led by the attending physician.  Recommendations may be updated based on patient status, additional functional criteria and insurance authorization.  ? ?Follow Up Recommendations ? No OT follow up  ?  ?Assistance Recommended at Discharge None  ?Patient can return home with the following   ? ?  ?Functional Status Assessment ?    ?Equipment Recommendations ? None recommended by OT  ?  ?Recommendations for Other Services   ? ? ?  ?Precautions / Restrictions Precautions ?Precautions: None  ? ?  ? ?Mobility Bed Mobility ?Overal bed mobility: Independent ?  ?  ?  ?  ?  ?  ?  ?  ? ?Transfers ?Overall transfer level: Independent ?  ?  ?  ?  ?  ?  ?  ?  ?  ?  ? ?  ?Balance Overall balance assessment: Independent ?  ?  ?  ?  ?  ?  ?  ?  ?  ?  ?  ?  ?  ?  ?  ?  ?  ?  ?   ? ?ADL either performed or assessed with clinical judgement  ? ?ADL Overall ADL's : Independent ?  ?  ?  ?  ?  ?  ?  ?  ?  ?  ?  ?  ?  ?  ?  ?  ?  ?  ?  ?   ? ? ? ?Vision Baseline Vision/History: 0 No visual deficits ?Ability to See in Adequate Light: 0 Adequate ?Patient Visual Report: No change from baseline ?   ?   ?Perception   ?  ?Praxis   ?  ? ?Pertinent Vitals/Pain Pain Assessment ?Pain Assessment: No/denies pain   ? ? ? ?Hand Dominance Right ?  ?Extremity/Trunk Assessment Upper Extremity Assessment ?Upper Extremity Assessment: Overall WFL for tasks assessed ?  ?Lower Extremity Assessment ?Lower Extremity Assessment: Overall WFL for tasks assessed ?  ?Cervical / Trunk Assessment ?Cervical / Trunk Assessment: Normal ?  ?Communication Communication ?Communication: No difficulties ?  ?Cognition Arousal/Alertness: Awake/alert ?Behavior During Therapy: [facility] for tasks assessed/performed ?Overall Cognitive Status: Within Functional Limits for tasks assessed ?  ?  ?  ?  ?  ?  ?  ?  ?  ?  ?  ?  ?  ?  ?  ?  ?  ?  ?  ?General Comments    ? ?  ?Exercises   ?  ?Shoulder Instructions    ? ? ?Home Living Family/patient expects to be discharged to:: Private residence ?Living Arrangements: Spouse/significant other ?Available Help at Discharge: Family;Available 24 hours/day ?Type of Home: House ?Home Access: Stairs to enter ?Entrance Stairs-Number of Steps: flight ?  ?Home Layout: Two level ?Alternate Level Stairs-Number of Steps: flight ?Alternate Level Stairs-Rails: Right;Left ?Bathroom Shower/Tub: Tub/shower unit ?  ?  Bathroom Toilet: Standard ?  ?  ?Home Equipment: None ?  ?  ?  ? ?  ?Prior Functioning/Environment Prior Level of Function : Independent/Modified Independent;Driving ?  ?  ?  ?  ?  ?  ?  ?  ?  ? ?  ?  ?OT Problem List:   ?  ?   ?OT Treatment/Interventions:    ?  ?OT Goals(Current goals can be found in the care plan section)    ?OT Frequency:   ?  ? ?Co-evaluation   ?  ?  ?  ?  ? ?  ?AM-PAC OT "6 Clicks" Daily Activity     ?Outcome Measure Help from another person eating meals?: None ?Help from another person taking care of personal grooming?: None ?Help from another person toileting, which includes using toliet, bedpan, or urinal?: None ?Help from another person bathing (including washing, rinsing, drying)?: None ?Help from another person to put on and taking off regular upper body clothing?: None ?Help from another person to  put on and taking off regular lower body clothing?: None ?6 Click Score: 24 ?  ?End of Session   ? ?Activity Tolerance: Patient tolerated treatment well ?Patient left: in bed;with call bell/phone within reach;Other (comment) (with echo) ? ?OT Visit Diagnosis: Hemiplegia and hemiparesis  ?              ?Time: [phone] ?OT Time Calculation (min): 15 min ?Charges:  OT General Charges ?$OT Visit: 1 Visit ?OT Evaluation ?$OT Eval Low Complexity: 1 Low ? ?Derek Atkins, OTR/L ?Acute Rehabilitation Services ?Pager: [phone] ?Office: [phone]  ? ?Derek Atkins ?06/09/2021, 9:41 AM ?

## 2021-06-09 NOTE — Discharge Instructions (Signed)
Derek Atkins, ? ?You were in the hospital and found to have a small stroke. Your medications have been adjusted. Please take aspirin indefinitely. You will take Plavix for a total of 21 days, including what you had in the hospital. You will also be transitioning from Pravastatin to Atorvastatin. You were noticed to have a Hemoglobin A1C of 7%. It would be good if this number can get below 7%. Please discuss management with your PCP. ?

## 2021-06-09 NOTE — ED Notes (Signed)
Breakfast orders placed 

## 2021-06-09 NOTE — Progress Notes (Addendum)
STROKE TEAM PROGRESS NOTE   INTERVAL HISTORY No family at the bedside.  Patient is neurologically and hemodynamically stable, no residual deficits.  He reports a 30 second episode of this numbness from his left face happening 2 days ago and then the episode yesterday was similar except it lasted for approximately 5 hours and involved his face and arm. Echo shows 60 to 65% ejection fraction.Marland Kitchen  MRI brain negative for acute stroke.  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  Urine drug screen is negative. Vitals:   06/09/21 0300 06/09/21 0400 06/09/21 0712 06/09/21 0712  BP: 123/75 (!) 126/56 137/81   Pulse: 67 (!) 37 70   Resp: 19 18 19    Temp:    97.7 F (36.5 C)  SpO2: 97% 96% 96%   Weight:      Height:       CBC:  Recent Labs  Lab 06/08/21 1725 06/08/21 1757 06/09/21 0121  WBC 7.3  --  7.3  NEUTROABS 4.2  --   --   HGB 15.3 16.0 15.1  HCT 45.5 47.0 44.6  MCV 89.4  --  88.8  PLT 208  --  194   Basic Metabolic Panel:  Recent Labs  Lab 06/08/21 1725 06/08/21 1757  NA 136 135  K 4.1 4.2  CL 106 106  CO2 24  --   GLUCOSE 112* 110*  BUN 15 18  CREATININE 1.30* 1.20  CALCIUM 9.2  --    Lipid Panel:  Recent Labs  Lab 06/09/21 0121  CHOL 187  TRIG 67  HDL 49  CHOLHDL 3.8  VLDL 13  LDLCALC 784*   HgbA1c:  Recent Labs  Lab 06/09/21 0121  HGBA1C 7.0*   Urine Drug Screen:  Recent Labs  Lab 06/08/21 1629  LABOPIA NONE DETECTED  COCAINSCRNUR NONE DETECTED  LABBENZ NONE DETECTED  AMPHETMU NONE DETECTED  THCU NONE DETECTED  LABBARB NONE DETECTED    Alcohol Level  Recent Labs  Lab 06/08/21 1725  ETH <10    IMAGING past 24 hours CT HEAD WO CONTRAST ( )  Result Date: 06/08/2021 CLINICAL DATA:  Left hand numbness EXAM: CT HEAD WITHOUT CONTRAST TECHNIQUE: Contiguous axial images were obtained from the base of the skull through the vertex without intravenous contrast. RADIATION DOSE REDUCTION: This exam  was performed according to the departmental dose-optimization program which includes automated exposure control, adjustment of the mA and/or kV according to patient size and/or use of iterative reconstruction technique. COMPARISON:  10/20/2017 FINDINGS: Brain: No evidence of acute infarction, hemorrhage, cerebral edema, mass, mass effect, or midline shift. No hydrocephalus or extra-axial fluid collection. Remote lacunar infarct in the right basal ganglia. Vascular: No hyperdense vessel. Atherosclerotic calcifications in the intracranial carotid and vertebral arteries. Skull: Normal. Negative for fracture or focal lesion. Sinuses/Orbits: Bubbly fluid in the bilateral maxillary sinuses and right sphenoid sinus. Mucosal thickening in the maxillary sinuses and ethmoid air cells. The orbits are unremarkable. Other: The mastoid air cells are well aerated. IMPRESSION: 1.  No acute intracranial process. 2. Bubbly fluid in the maxillary sinuses and right sphenoid sinus, as can be seen with acute sinusitis. Correlate with symptoms. Electronically Signed   By: Wiliam Ke M.D.   On: 06/08/2021 18:45   MR ANGIO HEAD WO CONTRAST  Result Date: 06/09/2021 CLINICAL DATA:  Left cheek in hand numbness EXAM: MRA HEAD WITHOUT CONTRAST TECHNIQUE: Angiographic images of the Circle of Willis were acquired using MRA technique without  intravenous contrast. COMPARISON:  Brain MRI from yesterday.  MRA report 02/20/2000 FINDINGS: Anterior circulation: Atheromatous irregularity of the carotid siphons. No branch occlusion, beading, or proximal flow limiting stenosis. Negative for aneurysm or vascular malformation. Posterior circulation: Dominant left vertebral artery with right vertebral artery ending in PICA. Extensive atheromatous irregularity of the basilar with proximal stenosis measuring 50%. Bilateral PCA atheromatous irregularity greater on the left there is moderate P1 and P2 segment stenoses measuring up to 50%. IMPRESSION:  Extensive intracranial atherosclerosis, especially of the posterior circulation where there is 50% narrowing of the proximal basilar and proximal left PCA. Electronically Signed   By: Tiburcio Pea M.D.   On: 06/09/2021 04:55   MR BRAIN WO CONTRAST  Result Date: 06/08/2021 CLINICAL DATA:  Transient ischemic attack.  Left hand numbness. EXAM: MRI HEAD WITHOUT CONTRAST TECHNIQUE: Multiplanar, multiecho pulse sequences of the brain and surrounding structures were obtained without intravenous contrast. COMPARISON:  Head CT same day.  MRI 12/10/2018. FINDINGS: Brain: Diffusion imaging shows a punctate acute infarction of the lateral right thalamus. This is adjacent to an old punctate infarction. Otherwise, the brainstem and cerebellum are normal. Cerebral hemispheres show mild chronic small-vessel ischemic change of the white matter. No large vessel territory infarction. No mass, hemorrhage, hydrocephalus or extra-axial collection. Vascular: Major vessels at the base of the brain show flow. Skull and upper cervical spine: Negative Sinuses/Orbits: Mucosal inflammatory disease of the paranasal sinuses, most pronounced affecting the maxillary sinuses. Orbits negative. Other: None IMPRESSION: Small acute infarction in the lateral right thalamus. Adjacent small old infarction. Chronic small-vessel ischemic changes affecting the cerebral hemispheric white matter. Sinus inflammatory disease, most pronounced affecting the maxillary sinuses. Electronically Signed   By: Paulina Fusi M.D.   On: 06/08/2021 20:24    PHYSICAL EXAM  Physical Exam  Constitutional: Appears well-developed and well-nourished.  Cardiovascular: Normal rate and regular rhythm.  Respiratory: Effort normal, non-labored breathing  Neuro: Mental Status: Patient is awake, alert, oriented to person, place, month, year, and situation. Patient is able to give a clear and coherent history. No signs of aphasia or neglect Cranial Nerves: II: Visual  Fields are full. Pupils are equal, round, and reactive to light.   III,IV, VI: EOMI without ptosis or diploplia.  V: Facial sensation is symmetric to temperature VII: Facial movement is symmetric resting and smiling VIII: Hearing is intact to voice X: Palate elevates symmetrically XI: Shoulder shrug is symmetric. XII: Tongue protrudes midline without atrophy or fasciculations.  Motor: Tone is normal. Bulk is normal. 5/5 strength was present in all four extremities.  Sensory: Sensation is symmetric to light touch and temperature in the arms and legs. No extinction to DSS present.  Cerebellar: FNF and HKS are intact bilaterally    ASSESSMENT/PLAN Derek Atkins is a 76 y.o. male with history of DM2, polymyalgia rheumatic, HLD presenting with numbness involving his left face, cheek, and arm.  He had an episode the day before yesterday that lasted approximately 30 seconds.  The episode yesterday lasted approximately 5 hours.  All symptoms have since resolved.  Plan for DAPT therapy with aspirin and Plavix for 3 weeks and then aspirin 81 mg alone.  Patient agrees to change pravastatin 20 mg to daily.   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  CTA head & neck extensive intracranial atherosclerosis, especially in the posterior circulation.  50% narrowing of the proximal basilar and proximal left PCA. MRI old rt thalamic stroke,  no acute abnormality 2D Echo EF 60-65% LV normal function.  Grade 1 diastolic dysfunction. Carotid ultrasound-  LDL 125 HgbA1c 7.0 VTE prophylaxis -bilateral ICA velocities consistent with 1-39% stenosis    Diet   Diet Heart Room service appropriate? Yes; Fluid consistency: Thin   No antithrombotic prior to admission, now on aspirin 81 mg daily and clopidogrel 75 mg daily for 3 weeks and then ASA 81mg  alone.  Therapy recommendations:  pending Disposition:  pending  Hypertension Permissive  hypertension (OK if < 220/120) but gradually normalize in 5-7 days Long-term BP goal normotensive  Hyperlipidemia Home meds:  Pravastatin 20mg  weekly and zetia 10mg  daily, resumed in hospital Pravastatin daily LDL 125, goal < 70 Continue statin at discharge  Diabetes type II Controlled Home meds: None HgbA1c 7.0, goal < 7.0 CBGs Recent Labs    06/09/21 0647 06/09/21 0752  GLUCAP 109* 100*    SSI  Other Stroke Risk Factors Advanced Age >/= 65  ETOH use, alcohol level <10, advised to drink no more than 2 drink(s) a day Hx stroke/TIA Old right thalamic infarct noted on MRI  Other Active Problems AKI on CKD 2 Baseline cr 1.1 -> 1.3  Hospital day # 0  Patient seen and examined by NP/APP with MD. MD to update note as needed.   Elmer Picker, DNP, FNP-BC [facility] Pager: [phone]  STROKE MD NOTE :  I have personally obtained history,examined this patient, reviewed notes, independently viewed imaging studies, participated in medical decision making and plan of care.ROS completed by me personally and pertinent positives fully documented  I have made any additions or clarifications directly to the above note. Agree with note above.  Patient presented with transient left upper extremity numbness which is recurrent and MRI scan confirms a small right thalamic lacunar infarct adjacent to her older infarct likely from small vessel disease.  Recommend aspirin Plavix for 3 weeks followed by aspirin alone and aggressive risk factor modification.  Follow-up as an outpatient stroke clinic in 2 months.  Discussed with Dr. Caleb Popp.  Greater than 50% time during this 50-minute visit was spent in counseling and coordination of care about lacunar stroke and discussion of stroke prevention and treatment answering questions.  Delia Heady, MD Medical Director [facility] Pager: [phone] 06/09/2021 3:55 PM  To contact Stroke Continuity provider, please refer to  [email or web address]. After hours, contact General Neurology

## 2021-06-09 NOTE — Discharge Summary (Signed)
?Physician Discharge Summary ?  ?Patient: Derek Atkins MRN: [id number] DOB: [id number]  ?Admit date:     06/08/2021  ?Discharge date: 06/09/21  ?Discharge Physician: Jacquelin Hawking, MD  ? ?PCP: Daisy Floro, MD  ? ?Recommendations at discharge:  ?PCP follow-up in 1 week ?Neurology follow-up in 4 weeks ? ?Discharge Diagnoses: ?Principal Problem: ?  Acute ischemic stroke (HCC) ?Active Problems: ?  PMR (polymyalgia rheumatica) (HCC) ?  Type 2 diabetes mellitus with hyperlipidemia (HCC) ?  CKD (chronic kidney disease), stage II ? ?Resolved Problems: ?  * No resolved hospital problems. * ? ?Assessment and Plan: ? ?Acute ischemic stroke ?MRI confirms small right thalamic lacunar infarct. Carotid ultrasound significant for bilateral widely patent carotids bilaterally and antegrade vertebral artery flow. LDL of 125. Hemoglobin A1C of 7.0%. Transthoracic Echocardiogram significant for an LVEF of 60-65%, no thrombus, no mention of atrial level shunt. Neurology recommendations for dual antiplatelet therapy with aspirin indefinitely and Plavix for a total of 3 weeks. PT/OT recommendations for no outpatient follow-up. Patient discharged on Aspirin, Plavix and Lipitor. Recommendation for Neurology follow-up in 4 weeks. ? ?Diabetes mellitus, type 2 ?Diet controlled. ? ?PMR ?Continue prednisone. ? ?CKD stage II ?Stable. ? ?  ? ? ?Consultants: Neurology ?Procedures performed: Transthoracic Echocardiogram, carotid ultrasound  ?Disposition: Home ?Diet recommendation:  ?Cardiac and Carb modified diet ? ?DISCHARGE MEDICATION: ?Allergies as of 06/09/2021   ?No Known Allergies ?  ? ?  ?Medication List  ?  ? ?STOP taking these medications   ? ?meclizine 12.5 MG tablet ?Commonly known as: ANTIVERT ?  ?pravastatin 20 MG tablet ?Commonly known as: PRAVACHOL ?  ? ?  ? ?TAKE these medications   ? ?aspirin 81 MG EC tablet ?Take 1 tablet (81 mg total) by mouth daily. Swallow whole. ?Start taking on: Jun 10, 2021 ?  ?atorvastatin 40 MG  tablet ?Commonly known as: Lipitor ?Take 1 tablet (40 mg total) by mouth daily. ?  ?clopidogrel 75 MG tablet ?Commonly known as: PLAVIX ?Take 1 tablet (75 mg total) by mouth daily for 20 days. ?Start taking on: Jun 10, 2021 ?  ?ezetimibe 10 MG tablet ?Commonly known as: ZETIA ?Take 10 mg by mouth daily. ?  ?predniSONE 5 MG tablet ?Commonly known as: DELTASONE ?Take 1 mg by mouth daily with breakfast. ?  ? ?  ? ? Follow-up Information   ? ? Micki Riley, MD Follow up in 4 week(s).   ?Specialties: Neurology, Radiology ?Why: For hospital follow-up ?Contact information: ?[address] ?[address] ?[place] [address] ?[phone] ? ? ?  ?  ? ?  ?  ? ?  ? ?Discharge Exam: ?BP 138/76   Pulse 80   Temp 97.7 ?F (36.5 ?C)   Resp 17   Ht 5\' 8"  (1.727 m)   Wt 84.4 kg   SpO2 96%   BMI 28.28 kg/m?  ? ?General exam: Appears calm and comfortable ?Respiratory system: Clear to auscultation. Respiratory effort normal. ?Cardiovascular system: S1 & S2 heard, RRR. No murmurs, rubs, gallops or clicks. ?Gastrointestinal system: Abdomen is nondistended, soft and nontender. No organomegaly or masses felt. Normal bowel sounds heard. ?Central nervous system: Alert and oriented. No focal neurological deficits. ?Musculoskeletal: No edema. No calf tenderness ?Skin: No cyanosis. No rashes ?Psychiatry: Judgement and insight appear normal. Mood & affect appropriate.  ? ?Condition at discharge: stable ? ?The results of significant diagnostics from this hospitalization (including imaging, microbiology, ancillary and laboratory) are listed below for reference.  ? ?Imaging Studies: ?CT HEAD  WO CONTRAST ( ) ? ?Result Date: 06/08/2021 ?CLINICAL DATA:  Left hand numbness EXAM: CT HEAD WITHOUT CONTRAST TECHNIQUE: Contiguous axial images were obtained from the base of the skull through the vertex without intravenous contrast. RADIATION DOSE REDUCTION: This exam was performed according to the departmental dose-optimization program which includes  automated exposure control, adjustment of the mA and/or kV according to patient size and/or use of iterative reconstruction technique. COMPARISON:  10/20/2017 FINDINGS: Brain: No evidence of acute infarction, hemorrhage, cerebral edema, mass, mass effect, or midline shift. No hydrocephalus or extra-axial fluid collection. Remote lacunar infarct in the right basal ganglia. Vascular: No hyperdense vessel. Atherosclerotic calcifications in the intracranial carotid and vertebral arteries. Skull: Normal. Negative for fracture or focal lesion. Sinuses/Orbits: Bubbly fluid in the bilateral maxillary sinuses and right sphenoid sinus. Mucosal thickening in the maxillary sinuses and ethmoid air cells. The orbits are unremarkable. Other: The mastoid air cells are well aerated. IMPRESSION: 1.  No acute intracranial process. 2. Bubbly fluid in the maxillary sinuses and right sphenoid sinus, as can be seen with acute sinusitis. Correlate with symptoms. Electronically Signed   By: Wiliam Ke M.D.   On: 06/08/2021 18:45  ? ?MR ANGIO HEAD WO CONTRAST ? ?Result Date: 06/09/2021 ?CLINICAL DATA:  Left cheek in hand numbness EXAM: MRA HEAD WITHOUT CONTRAST TECHNIQUE: Angiographic images of the Circle of Willis were acquired using MRA technique without intravenous contrast. COMPARISON:  Brain MRI from yesterday.  MRA report 02/20/2000 FINDINGS: Anterior circulation: Atheromatous irregularity of the carotid siphons. No branch occlusion, beading, or proximal flow limiting stenosis. Negative for aneurysm or vascular malformation. Posterior circulation: Dominant left vertebral artery with right vertebral artery ending in PICA. Extensive atheromatous irregularity of the basilar with proximal stenosis measuring 50%. Bilateral PCA atheromatous irregularity greater on the left there is moderate P1 and P2 segment stenoses measuring up to 50%. IMPRESSION: Extensive intracranial atherosclerosis, especially of the posterior circulation where there  is 50% narrowing of the proximal basilar and proximal left PCA. Electronically Signed   By: Tiburcio Pea M.D.   On: 06/09/2021 04:55  ? ?MR BRAIN WO CONTRAST ? ?Result Date: 06/08/2021 ?CLINICAL DATA:  Transient ischemic attack.  Left hand numbness. EXAM: MRI HEAD WITHOUT CONTRAST TECHNIQUE: Multiplanar, multiecho pulse sequences of the brain and surrounding structures were obtained without intravenous contrast. COMPARISON:  Head CT same day.  MRI 12/10/2018. FINDINGS: Brain: Diffusion imaging shows a punctate acute infarction of the lateral right thalamus. This is adjacent to an old punctate infarction. Otherwise, the brainstem and cerebellum are normal. Cerebral hemispheres show mild chronic small-vessel ischemic change of the white matter. No large vessel territory infarction. No mass, hemorrhage, hydrocephalus or extra-axial collection. Vascular: Major vessels at the base of the brain show flow. Skull and upper cervical spine: Negative Sinuses/Orbits: Mucosal inflammatory disease of the paranasal sinuses, most pronounced affecting the maxillary sinuses. Orbits negative. Other: None IMPRESSION: Small acute infarction in the lateral right thalamus. Adjacent small old infarction. Chronic small-vessel ischemic changes affecting the cerebral hemispheric white matter. Sinus inflammatory disease, most pronounced affecting the maxillary sinuses. Electronically Signed   By: Paulina Fusi M.D.   On: 06/08/2021 20:24  ? ?ECHOCARDIOGRAM COMPLETE ? ?Result Date: 06/09/2021 ?   ECHOCARDIOGRAM REPORT   Patient Name:   MILIK GILREATH Date of Exam: 06/09/2021 Medical Rec #:  [id number]        Height:       68.0 in Accession #:    [id number]       Weight:  186.0 lb Date of Birth:  09/25/1945        BSA:          1.982 m? Patient Age:    75 years         BP:           141/77 mmHg Patient Gender: M                HR:           55 bpm. Exam Location:  Inpatient Procedure: 2D Echo, Color Doppler and Limited Color Doppler  Indications:    Stroke  History:        Patient has no prior history of Echocardiogram examinations.                 Risk Factors:Diabetes.  Sonographer:    Rodrigo Ranesiree Vazquez RCS Referring Phys: [id number] Lavone NeriIMOTHY S OPYD IMPRE

## 2021-06-09 NOTE — Progress Notes (Signed)
PT Cancellation Note ? ?Patient Details ?Name: Derek Atkins ?MRN: [id number] ?DOB: [birth year] ? ? ?Cancelled Treatment:    Reason Eval/Treat Not Completed: PT screened, no needs identified, will sign off Per OT, pt independent. If needs change, please re-consult.  ? ?[name], PT, DPT  ?Acute Rehabilitation Services  ?Pager: [phone] ?Office: [phone] ? ? ? ?[place] ?06/09/2021, 9:51 AM ?

## 2021-07-15 ENCOUNTER — Ambulatory Visit (INDEPENDENT_AMBULATORY_CARE_PROVIDER_SITE_OTHER): Payer: Medicare Other | Admitting: Neurology

## 2021-07-15 ENCOUNTER — Encounter: Payer: Self-pay | Admitting: Neurology

## 2021-07-15 VITALS — BP 133/79 | HR 87 | Ht 68.0 in | Wt 186.0 lb

## 2021-07-15 DIAGNOSIS — I6381 Other cerebral infarction due to occlusion or stenosis of small artery: Secondary | ICD-10-CM | POA: Diagnosis not present

## 2021-07-15 DIAGNOSIS — R202 Paresthesia of skin: Secondary | ICD-10-CM | POA: Diagnosis not present

## 2021-07-15 NOTE — Patient Instructions (Signed)
I had a long d/w patient about his recent thalamic lacunar stroke, risk for recurrent stroke/TIAs, personally independently reviewed imaging studies and stroke evaluation results and answered questions.Continue aspirin 81 mg daily  for secondary stroke prevention and maintain strict control of hypertension with blood pressure goal below 130/90, diabetes with hemoglobin A1c goal below 6.5% and lipids with LDL cholesterol goal below 70 mg/dL. I also advised the patient to eat a healthy diet with plenty of whole grains, cereals, fruits and vegetables, exercise regularly and maintain ideal body weight Followup in the future with my nurse practitioner in 6 months or call earlier if necessary. Stroke Prevention Some medical conditions and behaviors can lead to a higher chance of having a stroke. You can help prevent a stroke by eating healthy, exercising, not smoking, and managing any medical conditions you have. Stroke is a leading cause of functional impairment. Primary prevention is particularly important because a majority of strokes are first-time events. Stroke changes the lives of not only those who experience a stroke but also their family and other caregivers. How can this condition affect me? A stroke is a medical emergency and should be treated right away. A stroke can lead to brain damage and can sometimes be life-threatening. If a person gets medical treatment right away, there is a better chance of surviving and recovering from a stroke. What can increase my risk? The following medical conditions may increase your risk of a stroke: Cardiovascular disease. High blood pressure (hypertension). Diabetes. High cholesterol. Sickle cell disease. Blood clotting disorders (hypercoagulable state). Obesity. Sleep disorders (obstructive sleep apnea). Other risk factors include: Being older than age 68. Having a history of blood clots, stroke, or mini-stroke (transient ischemic attack, TIA). Genetic  factors, such as race, ethnicity, or a family history of stroke. Smoking cigarettes or using other tobacco products. Taking birth control pills, especially if you also use tobacco. Heavy use of alcohol or drugs, especially cocaine and methamphetamine. Physical inactivity. What actions can I take to prevent this? Manage your health conditions High cholesterol levels. Eating a healthy diet is important for preventing high cholesterol. If cholesterol cannot be managed through diet alone, you may need to take medicines. Take any prescribed medicines to control your cholesterol as told by your health care provider. Hypertension. To reduce your risk of stroke, try to keep your blood pressure below 130/80. Eating a healthy diet and exercising regularly are important for controlling blood pressure. If these steps are not enough to manage your blood pressure, you may need to take medicines. Take any prescribed medicines to control hypertension as told by your health care provider. Ask your health care provider if you should monitor your blood pressure at home. Have your blood pressure checked every year, even if your blood pressure is normal. Blood pressure increases with age and some medical conditions. Diabetes. Eating a healthy diet and exercising regularly are important parts of managing your blood sugar (glucose). If your blood sugar cannot be managed through diet and exercise, you may need to take medicines. Take any prescribed medicines to control your diabetes as told by your health care provider. Get evaluated for obstructive sleep apnea. Talk to your health care provider about getting a sleep evaluation if you snore a lot or have excessive sleepiness. Make sure that any other medical conditions you have, such as atrial fibrillation or atherosclerosis, are managed. Nutrition Follow instructions from your health care provider about what to eat or drink to help manage your health condition. These  instructions may include: Reducing your daily calorie intake. Limiting how much salt (sodium) you use to 1,500 milligrams (mg) each day. Using only healthy fats for cooking, such as olive oil, canola oil, or sunflower oil. Eating healthy foods. You can do this by: Choosing foods that are high in fiber, such as whole grains, and fresh fruits and vegetables. Eating at least 5 servings of fruits and vegetables a day. Try to fill one-half of your plate with fruits and vegetables at each meal. Choosing lean protein foods, such as lean cuts of meat, poultry without skin, fish, tofu, beans, and nuts. Eating low-fat dairy products. Avoiding foods that are high in sodium. This can help lower blood pressure. Avoiding foods that have saturated fat, trans fat, and cholesterol. This can help prevent high cholesterol. Avoiding processed and prepared foods. Counting your daily carbohydrate intake.  Lifestyle If you drink alcohol: Limit how much you have to: 0-1 drink a day for women who are not pregnant. 0-2 drinks a day for men. Know how much alcohol is in your drink. In the [place] one drink equals one 12 oz bottle of beer (337mL), one 5 oz glass of wine (166mL), or one 1 oz glass of hard liquor (20mL). Do not use any products that contain nicotine or tobacco. These products include cigarettes, chewing tobacco, and vaping devices, such as e-cigarettes. If you need help quitting, ask your health care provider. Avoid secondhand smoke. Do not use drugs. Activity  Try to stay at a healthy weight. Get at least 30 minutes of exercise on most days, such as: Fast walking. Biking. Swimming. Medicines Take over-the-counter and prescription medicines only as told by your health care provider. Aspirin or blood thinners (antiplatelets or anticoagulants) may be recommended to reduce your risk of forming blood clots that can lead to stroke. Avoid taking birth control pills. Talk to your health care provider about  the risks of taking birth control pills if: You are over 9 years old. You smoke. You get very bad headaches. You have had a blood clot. Where to find more information American Stroke Association: www.strokeassociation.org Get help right away if: You or a loved one has any symptoms of a stroke. "BE FAST" is an easy way to remember the main warning signs of a stroke: B - Balance. Signs are dizziness, sudden trouble walking, or loss of balance. E - Eyes. Signs are trouble seeing or a sudden change in vision. F - Face. Signs are sudden weakness or numbness of the face, or the face or eyelid drooping on one side. A - Arms. Signs are weakness or numbness in an arm. This happens suddenly and usually on one side of the body. S - Speech. Signs are sudden trouble speaking, slurred speech, or trouble understanding what people say. T - Time. Time to call emergency services. Write down what time symptoms started. You or a loved one has other signs of a stroke, such as: A sudden, severe headache with no known cause. Nausea or vomiting. Seizure. These symptoms may represent a serious problem that is an emergency. Do not wait to see if the symptoms will go away. Get medical help right away. Call your local emergency services (911 in the [place] Do not drive yourself to the hospital. Summary You can help to prevent a stroke by eating healthy, exercising, not smoking, limiting alcohol intake, and managing any medical conditions you may have. Do not use any products that contain nicotine or tobacco. These include cigarettes, chewing tobacco, and  vaping devices, such as e-cigarettes. If you need help quitting, ask your health care provider. Remember "BE FAST" for warning signs of a stroke. Get help right away if you or a loved one has any of these signs. This information is not intended to replace advice given to you by your health care provider. Make sure you discuss any questions you have with your health care  provider. Document Revised: 08/19/2019 Document Reviewed: 08/19/2019 Elsevier Patient Education  [place].

## 2021-07-15 NOTE — Progress Notes (Signed)
[facility] [address] [place]. [place] [address] [phone]       OFFICE FOLLOW-UP NOTE  [name] Date of Birth:  1945-06-04 Medical Record Number:  [id number]   HPI: [name] is a 76 year old pleasant Caucasian male seen today for initial office visit following hospital admission for stroke in May 2023.  History is obtained from the patient and review of electronic medical records and I have personally reviewed pertinent available imaging films in PACS.  He has past medical history of hyperlipidemia, diabetes, polymyalgia rheumatica who presented on 06/08/2021 with sudden onset of left hand and cheek numbness since day prior.  This was happening intermittently and he had 5 episodes each lasting about 5 to 10 minutes and eventually came to the ED for evaluation.  His NIH stroke scale was found to be 0.  CT head was unremarkable but MRI scan of the brain was showed right thalamic lacunar infarct.  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  Urine drug screen was negative.  Echocardiogram showed ejection fraction of 60 to 65% with no cardiac source of embolism.  LDL cholesterol was elevated at 125 mg percent and hemoglobin A1c was 7.0.  Patient was started on aspirin and Plavix for 3 weeks then on aspirin alone.  Patient states is done well most of his numbness is resolved except he had a little tingling on the tip of his left index finger which is off-and-on and not bothersome.  He is tolerating aspirin well without bruising or bleeding.  He is also tolerating Lipitor well without muscle aches and pains.  His blood pressure is quite good and today it is 133/79.  He has an appointment with primary care physician in July and and will likely have follow-up lipid profile checked at that visit.  He has no new complaints.  He returned back to baseline.  He does not smoke and rarely has alcohol but not more than 1 drink at a  time.  ROS:   14 system review of systems is positive for numbness, tingling and all other systems negative  PMH:  Past Medical History:  Diagnosis Date   Diabetes mellitus without complication (HCC)    PMR (polymyalgia rheumatica) (HCC)     Social History:  Social History   Socioeconomic History   Marital status: Married    Spouse name: Derek Atkins   Number of children: Not on file   Years of education: Not on file   Highest education level: Not on file  Occupational History   Not on file  Tobacco Use   Smoking status: Never   Smokeless tobacco: Never  Vaping Use   Vaping Use: Never used  Substance and Sexual Activity   Alcohol use: Yes    Comment: 1 glass wine per night   Drug use: Never   Sexual activity: Not on file  Other Topics Concern   Not on file  Social History Narrative   Lives with wife, Derek Atkins   Caffeine use: tea daily   Diet coke daily   Right handed    Social Determinants of Health   Financial Resource Strain: Not on file  Food Insecurity: Not on file  Transportation Needs: Not on file  Physical Activity: Not on file  Stress: Not on file  Social Connections: Not on file  Intimate Partner Violence: Not on file    Medications:   Current Outpatient Medications on File Prior to Visit  Medication Sig Dispense Refill   aspirin EC 81 MG EC tablet Take 1 tablet (81 mg total) by mouth daily. Swallow whole. 30 tablet 2   atorvastatin (LIPITOR) 40 MG tablet Take 1 tablet (40 mg total) by mouth daily. 30 tablet 2   ezetimibe (ZETIA) 10 MG tablet Take 10 mg by mouth daily.     predniSONE (DELTASONE) 5 MG tablet Take 1 mg by mouth daily with breakfast.      No current facility-administered medications on file prior to visit.    Allergies:  No Known Allergies  Physical Exam General: well developed, well nourished pleasant elderly Caucasian male, seated, in no evident distress Head: head normocephalic and atraumatic.  Neck: supple with no carotid or  supraclavicular bruits Cardiovascular: regular rate and rhythm, no murmurs Musculoskeletal: no deformity Skin:  no rash/petichiae Vascular:  Normal pulses all extremities Vitals:   07/15/21 0758  BP: 133/79  Pulse: 87   Neurologic Exam Mental Status: Awake and fully alert. Oriented to place and time. Recent and remote memory intact. Attention span, concentration and fund of knowledge appropriate. Mood and affect appropriate.  Cranial Nerves: Fundoscopic exam reveals sharp disc margins. Pupils equal, briskly reactive to light. Extraocular movements full without nystagmus. Visual fields full to confrontation. Hearing intact. Facial sensation intact. Face, tongue, palate moves normally and symmetrically.  Motor: Normal bulk and tone. Normal strength in all tested extremity muscles. Sensory.: intact to touch ,pinprick .position and vibratory sensation.  Coordination: Rapid alternating movements normal in all extremities. Finger-to-nose and heel-to-shin performed accurately bilaterally. Gait and Station: Arises from chair without difficulty. Stance is normal. Gait demonstrates normal stride length and balance . Able to heel, toe and tandem walk without difficulty.  Reflexes: 1+ and symmetric. Toes downgoing.   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  Vascular risk factors of hyperlipidemia and age only     PLAN: I had a long d/w patient about his recent thalamic lacunar stroke, risk for recurrent stroke/TIAs, personally independently reviewed imaging studies and stroke evaluation results and answered questions.Continue aspirin 81 mg daily  for secondary stroke prevention and maintain strict control of hypertension with blood pressure goal below 130/90, diabetes with hemoglobin A1c goal below 6.5% and lipids with LDL cholesterol goal below 70 mg/dL. I also advised the patient to eat a healthy  diet with plenty of whole grains, cereals, fruits and vegetables, exercise regularly and maintain ideal body weight Followup in the future with my nurse practitioner in 6 months or call earlier if necessary.Greater than 50% of time during this 35 minute visit was spent on counseling,explanation of diagnosis of lacunar stroke, planning of further management, discussion with patient and family and coordination of care Delia Heady, MD Note: This document was prepared with digital dictation and possible smart phrase technology. Any transcriptional errors that result from this process are unintentional

## 2022-01-17 NOTE — Progress Notes (Unsigned)
[facility] [address] [place]. [place] [address] [phone]       OFFICE FOLLOW-UP NOTE  Derek Atkins Date of Birth:  07/23/1945 Medical Record Number:  [id number]    Reason for visit: stroke f/u   No chief complaint on file.     HPI:   Update 01/18/2022 JM: returns for 6 month stroke f/u. Overall stable. No new stroke/TIA symptoms. Reports residual ***. Complaint on aspirin, atorvastatin and zetia. Blood pressure well controlled. Routinely follows with PCP.      History provided for reference purposes only Initial visit 07/15/2021 Dr. Pearlean Atkins: Derek Atkins is a 76 year old pleasant Caucasian male seen today for initial office visit following hospital admission for stroke in May 2023.  History is obtained from the patient and review of electronic medical records and I have personally reviewed pertinent available imaging films in PACS.  He has past medical history of hyperlipidemia, diabetes, polymyalgia rheumatica who presented on 06/08/2021 with sudden onset of left hand and cheek numbness since day prior.  This was happening intermittently and he had 5 episodes each lasting about 5 to 10 minutes and eventually came to the ED for evaluation.  His NIH stroke scale was found to be 0.  CT head was unremarkable but MRI scan of the brain was showed right thalamic lacunar infarct.  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  Urine drug screen was negative.  Echocardiogram showed ejection fraction of 60 to 65% with no cardiac source of embolism.  LDL cholesterol was elevated at 125 mg percent and hemoglobin A1c was 7.0.  Patient was started on aspirin and Plavix for 3 weeks then on aspirin alone.  Patient states is done well most of his numbness is resolved except he had a little tingling on the tip of his left index finger which is off-and-on and not bothersome.  He is tolerating aspirin well without bruising or  bleeding.  He is also tolerating Lipitor well without muscle aches and pains.  His blood pressure is quite good and today it is 133/79.  He has an appointment with primary care physician in July and and will likely have follow-up lipid profile checked at that visit.  He has no new complaints.  He returned back to baseline.  He does not smoke and rarely has alcohol but not more than 1 drink at a time.    ROS:   14 system review of systems is positive for those listed in HPI and all other systems negative  PMH:  Past Medical History:  Diagnosis Date   Diabetes mellitus without complication (HCC)    PMR (polymyalgia rheumatica) (HCC)     Social History:  Social History   Socioeconomic History   Marital status: Married    Spouse name: Derek Atkins   Number of children: Not on file   Years of education: Not on file   Highest education level: Not on file  Occupational History   Not on file  Tobacco Use   Smoking status: Never   Smokeless tobacco: Never  Vaping Use   Vaping Use: Never used  Substance and Sexual Activity   Alcohol use: Yes    Comment: 1 glass wine per night   Drug use: Never   Sexual activity: Not on file  Other Topics Concern   Not on file  Social History Narrative   Lives with wife, Derek Atkins   Caffeine use: tea daily   Diet coke  daily   Right handed    Social Determinants of Health   Financial Resource Strain: Not on file  Food Insecurity: Not on file  Transportation Needs: Not on file  Physical Activity: Not on file  Stress: Not on file  Social Connections: Not on file  Intimate Partner Violence: Not on file    Medications:   Current Outpatient Medications on File Prior to Visit  Medication Sig Dispense Refill   atorvastatin (LIPITOR) 40 MG tablet Take 1 tablet (40 mg total) by mouth daily. 30 tablet 2   ezetimibe (ZETIA) 10 MG tablet Take 10 mg by mouth daily.     predniSONE (DELTASONE) 5 MG tablet Take 1 mg by mouth daily with breakfast.      No  current facility-administered medications on file prior to visit.    Allergies:  No Known Allergies  Physical Exam There were no vitals filed for this visit. There is no height or weight on file to calculate BMI.   General: well developed, well nourished pleasant elderly Caucasian male, seated, in no evident distress Head: head normocephalic and atraumatic.  Neck: supple with no carotid or supraclavicular bruits Cardiovascular: regular rate and rhythm, no murmurs Musculoskeletal: no deformity Skin:  no rash/petichiae Vascular:  Normal pulses all extremities  Neurologic Exam Mental Status: Awake and fully alert. Oriented to place and time. Recent and remote memory intact. Attention span, concentration and fund of knowledge appropriate. Mood and affect appropriate.  Cranial Nerves: Pupils equal, briskly reactive to light. Extraocular movements full without nystagmus. Visual fields full to confrontation. Hearing intact. Facial sensation intact. Face, tongue, palate moves normally and symmetrically.  Motor: Normal bulk and tone. Normal strength in all tested extremity muscles. Sensory.: intact to touch ,pinprick .position and vibratory sensation.  Coordination: Rapid alternating movements normal in all extremities. Finger-to-nose and heel-to-shin performed accurately bilaterally. Gait and Station: Arises from chair without difficulty. Stance is normal. Gait demonstrates normal stride length and balance . Able to heel, toe and tandem walk without difficulty.  Reflexes: 1+ and symmetric. Toes downgoing.        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  Vascular risk factors of hyperlipidemia and age only    -Continue aspirin 81 mg daily, atorvastatin and Zetia for secondary stroke prevention -Continue close PCP follow-up for aggressive stroke risk factor management, maintain strict control of lipids with LDL  cholesterol goal below 70 mg/dL.     I spent *** minutes of face-to-face and non-face-to-face time with patient.  This included previsit chart review, lab review, study review, order entry, electronic health record documentation, patient education  Ihor Austin, [facility]  [facility] [address] [place], [place] [address]  Phone [phone] Fax [phone] Note: This document was prepared with digital dictation and possible smart phrase technology. Any transcriptional errors that result from this process are unintentional.

## 2022-01-18 ENCOUNTER — Ambulatory Visit (INDEPENDENT_AMBULATORY_CARE_PROVIDER_SITE_OTHER): Payer: Medicare Other | Admitting: Adult Health

## 2022-01-18 ENCOUNTER — Encounter: Payer: Self-pay | Admitting: Adult Health

## 2022-01-18 VITALS — BP 138/74 | HR 60 | Ht 68.0 in | Wt 198.0 lb

## 2022-01-18 DIAGNOSIS — I6381 Other cerebral infarction due to occlusion or stenosis of small artery: Secondary | ICD-10-CM | POA: Diagnosis not present

## 2022-01-18 NOTE — Patient Instructions (Signed)
Continue aspirin 81 mg daily  and atorvastatin and Zetia for secondary stroke prevention  Continue to follow up with PCP regarding blood pressure and cholesterol management  Maintain strict control of blood pressure with goal below 130/90 and cholesterol with LDL cholesterol (bad cholesterol) goal below 70 mg/dL.   Signs of a Stroke? Follow the BEFAST method:  Balance Watch for a sudden loss of balance, trouble with coordination or vertigo Eyes Is there a sudden loss of vision in one or both eyes? Or double vision?  Face: Ask the person to smile. Does one side of the face droop or is it numb?  Arms: Ask the person to raise both arms. Does one arm drift downward? Is there weakness or numbness of a leg? Speech: Ask the person to repeat a simple phrase. Does the speech sound slurred/strange? Is the person confused ? Time: If you observe any of these signs, call [phone].    Overall stable from stroke standpoint and can follow up as needed. Please do not hesitate to call with any stroke related questions or concerns.      Thank you for coming to see [place] at [facility]. I hope we have been able to provide you high quality care today.  You may receive a patient satisfaction survey over the next few weeks. We would appreciate your feedback and comments so that we may continue to improve ourselves and the health of our patients.
